# Patient Record
Sex: Male | Born: 1945 | Race: White | Hispanic: No | Marital: Married | Smoking: Former smoker
Health system: Southern US, Community
[De-identification: ages and names within clinical notes are randomized; demographics above are authoritative.]

## PROBLEM LIST (undated history)

## (undated) DIAGNOSIS — J309 Allergic rhinitis, unspecified: Secondary | ICD-10-CM

## (undated) DIAGNOSIS — L570 Actinic keratosis: Secondary | ICD-10-CM

## (undated) DIAGNOSIS — I10 Essential (primary) hypertension: Secondary | ICD-10-CM

## (undated) DIAGNOSIS — R7303 Prediabetes: Secondary | ICD-10-CM

## (undated) DIAGNOSIS — K219 Gastro-esophageal reflux disease without esophagitis: Secondary | ICD-10-CM

## (undated) DIAGNOSIS — N400 Enlarged prostate without lower urinary tract symptoms: Secondary | ICD-10-CM

## (undated) DIAGNOSIS — E785 Hyperlipidemia, unspecified: Secondary | ICD-10-CM

## (undated) HISTORY — DX: Actinic keratosis: L57.0

## (undated) HISTORY — PX: OTHER SURGICAL HISTORY: SHX169

## (undated) HISTORY — PX: CHOLECYSTECTOMY: SHX55

---

## 2004-11-04 ENCOUNTER — Ambulatory Visit: Payer: Self-pay | Admitting: Gastroenterology

## 2013-08-19 DIAGNOSIS — N5089 Other specified disorders of the male genital organs: Secondary | ICD-10-CM | POA: Insufficient documentation

## 2015-04-03 ENCOUNTER — Encounter: Payer: Self-pay | Admitting: *Deleted

## 2015-04-06 ENCOUNTER — Ambulatory Visit: Payer: Medicare PPO | Admitting: Anesthesiology

## 2015-04-06 ENCOUNTER — Ambulatory Visit
Admission: RE | Admit: 2015-04-06 | Discharge: 2015-04-06 | Disposition: A | Discharge status: Home / Self Care | Payer: Medicare PPO | Source: Ambulatory Visit | Attending: Gastroenterology | Admitting: Gastroenterology

## 2015-04-06 ENCOUNTER — Encounter
Admission: RE | Disposition: A | Discharge status: Home / Self Care | Payer: Self-pay | Source: Ambulatory Visit | Attending: Gastroenterology

## 2015-04-06 DIAGNOSIS — N400 Enlarged prostate without lower urinary tract symptoms: Secondary | ICD-10-CM | POA: Diagnosis not present

## 2015-04-06 DIAGNOSIS — E119 Type 2 diabetes mellitus without complications: Secondary | ICD-10-CM | POA: Diagnosis not present

## 2015-04-06 DIAGNOSIS — E785 Hyperlipidemia, unspecified: Secondary | ICD-10-CM | POA: Diagnosis not present

## 2015-04-06 DIAGNOSIS — K5730 Diverticulosis of large intestine without perforation or abscess without bleeding: Secondary | ICD-10-CM | POA: Diagnosis not present

## 2015-04-06 DIAGNOSIS — I10 Essential (primary) hypertension: Secondary | ICD-10-CM | POA: Insufficient documentation

## 2015-04-06 DIAGNOSIS — Z1211 Encounter for screening for malignant neoplasm of colon: Secondary | ICD-10-CM | POA: Diagnosis present

## 2015-04-06 HISTORY — PX: COLONOSCOPY WITH PROPOFOL: SHX5780

## 2015-04-06 HISTORY — DX: Hyperlipidemia, unspecified: E78.5

## 2015-04-06 HISTORY — DX: Benign prostatic hyperplasia without lower urinary tract symptoms: N40.0

## 2015-04-06 HISTORY — DX: Essential (primary) hypertension: I10

## 2015-04-06 HISTORY — DX: Gastro-esophageal reflux disease without esophagitis: K21.9

## 2015-04-06 HISTORY — DX: Allergic rhinitis, unspecified: J30.9

## 2015-04-06 LAB — GLUCOSE, CAPILLARY: Glucose-Capillary: 109 mg/dL — ABNORMAL HIGH (ref 65–99)

## 2015-04-06 SURGERY — COLONOSCOPY WITH PROPOFOL
Anesthesia: General

## 2015-04-06 MED ORDER — SODIUM CHLORIDE 0.9 % IV SOLN
INTRAVENOUS | Status: DC
  Administered 2015-04-06: 1000 mL via INTRAVENOUS

## 2015-04-06 MED ORDER — LIDOCAINE HCL (CARDIAC) 20 MG/ML IV SOLN
INTRAVENOUS | Status: DC | PRN
  Administered 2015-04-06: 40 mg via INTRAVENOUS

## 2015-04-06 MED ORDER — PROPOFOL 10 MG/ML IV BOLUS
INTRAVENOUS | Status: DC | PRN
  Administered 2015-04-06: 50 mg via INTRAVENOUS

## 2015-04-06 MED ORDER — PROPOFOL INFUSION 10 MG/ML OPTIME
INTRAVENOUS | Status: DC | PRN
  Administered 2015-04-06: 140 ug/kg/min via INTRAVENOUS

## 2015-04-06 MED ORDER — SODIUM CHLORIDE 0.9 % IV SOLN: INTRAVENOUS | Status: DC

## 2015-04-06 MED ORDER — MIDAZOLAM HCL 2 MG/2ML IJ SOLN
INTRAMUSCULAR | Status: DC | PRN
  Administered 2015-04-06: 1 mg via INTRAVENOUS

## 2015-04-06 MED ORDER — FENTANYL CITRATE (PF) 100 MCG/2ML IJ SOLN
INTRAMUSCULAR | Status: DC | PRN
  Administered 2015-04-06: 50 ug via INTRAVENOUS

## 2015-04-06 NOTE — H&P (Signed)
    Primary Care Physician:  Melynda Ripple, MD Primary Gastroenterologist:  Dr. Candace Cruise  Pre-Procedure History & Physical: HPI:  Joshua Drake is a 69 y.o. male is here for an colonoscopy.   Past Medical History  Diagnosis Date  . Hypertension   . GERD (gastroesophageal reflux disease)   . Diabetes mellitus without complication   . BPH (benign prostatic hyperplasia)   . Allergic rhinitis   . Hyperlipidemia     Past Surgical History  Procedure Laterality Date  . Cholecystectomy    . Microwave procedure      microwave procedure for BPH    Prior to Admission medications   Medication Sig Start Date End Date Taking? Authorizing Provider  aspirin 81 MG tablet Take 81 mg by mouth daily.   Yes Historical Provider, MD  atorvastatin (LIPITOR) 10 MG tablet Take 10 mg by mouth daily.   Yes Historical Provider, MD  dutasteride (AVODART) 0.5 MG capsule Take 0.5 mg by mouth daily.   Yes Historical Provider, MD  esomeprazole (NEXIUM) 40 MG capsule Take 40 mg by mouth daily at 12 noon.   Yes Historical Provider, MD  lisinopril-hydrochlorothiazide (PRINZIDE,ZESTORETIC) 10-12.5 MG per tablet Take 1 tablet by mouth daily.   Yes Historical Provider, MD  triamcinolone cream (KENALOG) 0.5 % Apply 1 application topically 2 (two) times daily.   Yes Historical Provider, MD    Allergies as of 03/10/2015  . (Not on File)    History reviewed. No pertinent family history.  History   Social History  . Marital Status: Married    Spouse Name: N/A  . Number of Children: N/A  . Years of Education: N/A   Occupational History  . Not on file.   Social History Main Topics  . Smoking status: Former Smoker -- 0.50 packs/day for 4 years    Types: Cigarettes    Quit date: 10/04/1963  . Smokeless tobacco: Never Used  . Alcohol Use: No  . Drug Use: No  . Sexual Activity: Not on file   Other Topics Concern  . Not on file   Social History Narrative  . No narrative on file    Review of Systems: See  HPI, otherwise negative ROS  Physical Exam: There were no vitals taken for this visit. General:   Alert,  pleasant and cooperative in NAD Head:  Normocephalic and atraumatic. Neck:  Supple; no masses or thyromegaly. Lungs:  Clear throughout to auscultation.    Heart:  Regular rate and rhythm. Abdomen:  Soft, nontender and nondistended. Normal bowel sounds, without guarding, and without rebound.   Neurologic:  Alert and  oriented x4;  grossly normal neurologically.  Impression/Plan: DENSEL KRONICK is here for an colonoscopy for screening.  Risks, benefits, limitations, and alternatives regarding colonoscopy have been reviewed with the patient.  Questions have been answered.  All parties agreeable.   Harlow Carrizales, Lupita Dawn, MD  04/06/2015, 7:55 AM

## 2015-04-06 NOTE — Anesthesia Preprocedure Evaluation (Signed)
Anesthesia Evaluation  Patient identified by MRN, date of birth, ID band Patient awake    Reviewed: Allergy & Precautions, NPO status , Patient's Chart, lab work & pertinent test results  History of Anesthesia Complications Negative for: history of anesthetic complications  Airway Mallampati: II  TM Distance: >3 FB Neck ROM: Full    Dental  (+) Teeth Intact   Pulmonary former smoker,          Cardiovascular hypertension, Pt. on medications     Neuro/Psych    GI/Hepatic GERD-  Medicated and Controlled,  Endo/Other  diabetes  Renal/GU      Musculoskeletal   Abdominal   Peds  Hematology   Anesthesia Other Findings   Reproductive/Obstetrics                             Anesthesia Physical Anesthesia Plan  ASA: II  Anesthesia Plan: General   Post-op Pain Management:    Induction: Intravenous  Airway Management Planned: Nasal Cannula  Additional Equipment:   Intra-op Plan:   Post-operative Plan:   Informed Consent: I have reviewed the patients History and Physical, chart, labs and discussed the procedure including the risks, benefits and alternatives for the proposed anesthesia with the patient or authorized representative who has indicated his/her understanding and acceptance.     Plan Discussed with:   Anesthesia Plan Comments:         Anesthesia Quick Evaluation

## 2015-04-06 NOTE — Anesthesia Procedure Notes (Signed)
Date/Time: 04/06/2015 9:00 AM Performed by: Doreen Salvage Pre-anesthesia Checklist: Patient identified, Emergency Drugs available, Suction available and Patient being monitored Patient Re-evaluated:Patient Re-evaluated prior to inductionOxygen Delivery Method: Nasal cannula

## 2015-04-06 NOTE — Anesthesia Postprocedure Evaluation (Signed)
  Anesthesia Post-op Note  Patient: Joshua Drake  Procedure(s) Performed: Procedure(s): COLONOSCOPY WITH PROPOFOL (N/A)  Anesthesia type:General  Patient location: PACU  Post pain: Pain level controlled  Post assessment: Post-op Vital signs reviewed, Patient's Cardiovascular Status Stable, Respiratory Function Stable, Patent Airway and No signs of Nausea or vomiting  Post vital signs: Reviewed and stable  Last Vitals:  Filed Vitals:   04/06/15 0930  BP: 102/70  Pulse: 67  Temp: 36.1 C  Resp: 12    Level of consciousness: awake, alert  and patient cooperative  Complications: No apparent anesthesia complications

## 2015-04-06 NOTE — Transfer of Care (Signed)
Immediate Anesthesia Transfer of Care Note  Patient: Joshua Drake  Procedure(s) Performed: Procedure(s): COLONOSCOPY WITH PROPOFOL (N/A)  Patient Location: PACU and Endoscopy Unit  Anesthesia Type:General  Level of Consciousness: sedated  Airway & Oxygen Therapy: Patient Spontanous Breathing and Patient connected to nasal cannula oxygen  Post-op Assessment: Report given to RN and Post -op Vital signs reviewed and stable  Post vital signs: Reviewed and stable  Last Vitals:  Filed Vitals:   04/06/15 0928  BP: 102/70  Pulse: 70  Temp: 36.1 C  Resp:     Complications: No apparent anesthesia complications

## 2015-04-06 NOTE — Op Note (Signed)
[facility] Patient Name: Joshua Drake Procedure Date: 04/06/2015 8:58 AM MRN: [id number] Account #: [id number] Date of Birth: Mar 22, 1946 Admit Type: Outpatient Age: 69 Room: [facility] ENDO ROOM 4 Gender: Male Note Status: Finalized Procedure:         Colonoscopy Indications:       Screening for colorectal malignant neoplasm Providers:         Lupita Dawn. Candace Cruise, MD Referring MD:      Dion Body (Referring MD) Medicines:         Monitored Anesthesia Care Complications:     No immediate complications. Procedure:         Pre-Anesthesia Assessment:                    - Prior to the procedure, a History and Physical was                     performed, and patient medications, allergies and                     sensitivities were reviewed. The patient's tolerance of                     previous anesthesia was reviewed.                    - The risks and benefits of the procedure and the sedation                     options and risks were discussed with the patient. All                     questions were answered and informed consent was obtained.                    - After reviewing the risks and benefits, the patient was                     deemed in satisfactory condition to undergo the procedure.                    After obtaining informed consent, the colonoscope was                     passed under direct vision. Throughout the procedure, the                     patient's blood pressure, pulse, and oxygen saturations                     were monitored continuously. The Olympus Colonoscope                     PCF-160AL (S# [id number]) was introduced through the anus and                     advanced to the the cecum, identified by appendiceal                     orifice and ileocecal valve. The colonoscopy was performed                     with difficulty due to inadequate bowel prep. The patient  tolerated the procedure well. The quality of  the bowel                     preparation was fair. Findings:      Multiple small and large-mouthed diverticula were found in the sigmoid       colon.      The exam was otherwise without abnormality. Impression:        - Diverticulosis in the sigmoid colon.                    - The examination was otherwise normal.                    - No specimens collected. Recommendation:    - Discharge patient to home.                    - The findings and recommendations were discussed with the                     patient. Procedure Code(s): --- Professional ---                    [phone], Colonoscopy, flexible; diagnostic, including                     collection of specimen(s) by brushing or washing, when                     performed (separate procedure) Diagnosis Code(s): --- Professional ---                    Z12.11, Encounter for screening for malignant neoplasm of                     colon                    K57.30, Diverticulosis of large intestine without                     perforation or abscess without bleeding CPT copyright 2014 American Medical Association. All rights reserved. The codes documented in this report are preliminary and upon coder review may  be revised to meet current compliance requirements. [name], MD 04/06/2015 9:19:38 AM This report has been signed electronically. Number of Addenda: 0 Note Initiated On: 04/06/2015 8:58 AM Scope Withdrawal Time: 0 hours 5 minutes 3 seconds  Total Procedure Duration: 0 hours 10 minutes 36 seconds       [facility]

## 2015-04-06 NOTE — Addendum Note (Signed)
Addendum  created 04/06/15 1058 by Doreen Salvage, CRNA   Modules edited: Anesthesia Medication Administration

## 2015-04-07 ENCOUNTER — Encounter: Payer: Self-pay | Admitting: Gastroenterology

## 2016-01-28 ENCOUNTER — Other Ambulatory Visit (HOSPITAL_COMMUNITY): Payer: Self-pay | Admitting: Family Medicine

## 2016-01-28 DIAGNOSIS — Z7185 Encounter for immunization safety counseling: Secondary | ICD-10-CM | POA: Insufficient documentation

## 2016-01-28 DIAGNOSIS — Z0000 Encounter for general adult medical examination without abnormal findings: Secondary | ICD-10-CM

## 2016-01-28 DIAGNOSIS — Z87891 Personal history of nicotine dependence: Secondary | ICD-10-CM

## 2016-01-28 DIAGNOSIS — Z7189 Other specified counseling: Secondary | ICD-10-CM | POA: Insufficient documentation

## 2016-02-02 ENCOUNTER — Ambulatory Visit
Admission: RE | Admit: 2016-02-02 | Discharge: 2016-02-02 | Disposition: A | Discharge status: Home / Self Care | Payer: Medicare PPO | Source: Ambulatory Visit | Attending: Family Medicine | Admitting: Family Medicine

## 2016-02-02 DIAGNOSIS — Z0000 Encounter for general adult medical examination without abnormal findings: Secondary | ICD-10-CM | POA: Diagnosis not present

## 2016-02-02 DIAGNOSIS — Z87891 Personal history of nicotine dependence: Secondary | ICD-10-CM | POA: Insufficient documentation

## 2017-01-30 DIAGNOSIS — Z0000 Encounter for general adult medical examination without abnormal findings: Secondary | ICD-10-CM | POA: Insufficient documentation

## 2017-07-13 ENCOUNTER — Encounter: Payer: Self-pay | Admitting: Urology

## 2017-07-13 ENCOUNTER — Ambulatory Visit (INDEPENDENT_AMBULATORY_CARE_PROVIDER_SITE_OTHER): Payer: Medicare PPO | Admitting: Urology

## 2017-07-13 VITALS — BP 154/96 | HR 83 | Ht 66.0 in | Wt 173.0 lb

## 2017-07-13 DIAGNOSIS — N401 Enlarged prostate with lower urinary tract symptoms: Secondary | ICD-10-CM | POA: Diagnosis not present

## 2017-07-13 DIAGNOSIS — R972 Elevated prostate specific antigen [PSA]: Secondary | ICD-10-CM | POA: Insufficient documentation

## 2017-07-13 MED ORDER — DUTASTERIDE 0.5 MG PO CAPS: 0.5000 mg | ORAL_CAPSULE | Freq: Every day | ORAL | 1 refills | Status: DC

## 2017-07-13 NOTE — Progress Notes (Signed)
07/13/2017 6:41 PM   Joshua Drake May 29, 1946 [id number]  Referring provider: Dion Body, MD [facility] [place], [place] [address]  Chief Complaint  Patient presents with  . Elevated PSA    HPI: 71 year old male presents for follow-up of an elevated PSA.  Prostate biopsy was performed May 2013 for an uncorrected PSA of 12.2 with benign pathology.  Prostate volume was calculated at 81 cc.  He has a history of BPH and underwent microwave thermotherapy by Dr. Yves Dill in 2009.  He was placed on Avodart post procedure which he takes once per week.  But he has no bothersome lower urinary tract symptoms.  Denies dysuria or gross hematuria.  Denies flank, abdominal, pelvic or scrotal pain.  Uncorrected PSA August 2018 was within baseline at 6.24 however slightly higher than prior PSA in 2017 which was 4.09 and a 45-month follow-up was recommended.   PMH: Past Medical History:  Diagnosis Date  . Allergic rhinitis   . BPH (benign prostatic hyperplasia)   . Diabetes mellitus without complication ([place])   . GERD (gastroesophageal reflux disease)   . Hyperlipidemia   . Hypertension     Surgical History: Past Surgical History:  Procedure Laterality Date  . CHOLECYSTECTOMY    . COLONOSCOPY WITH PROPOFOL N/A 04/06/2015   Procedure: COLONOSCOPY WITH PROPOFOL;  Surgeon: [name], MD;  Location: [facility] ENDOSCOPY;  Service: Gastroenterology;  Laterality: N/A;  . microwave procedure     microwave procedure for BPH    Home Medications:  Allergies as of 07/13/2017   Not on File     Medication List       Accurate as of 07/13/17  6:41 PM. Always use your most recent med list.          amoxicillin 875 MG tablet Commonly known as:  AMOXIL   aspirin 81 MG tablet Take 81 mg by mouth daily.   atorvastatin 10 MG tablet Commonly known as:  LIPITOR Take 10 mg by mouth daily.   dutasteride 0.5 MG capsule Commonly known as:  AVODART Take 1 capsule (0.5 mg  total) by mouth daily.   esomeprazole 40 MG capsule Commonly known as:  NEXIUM Take 40 mg by mouth daily at 12 noon.   lisinopril-hydrochlorothiazide 10-12.5 MG tablet Commonly known as:  PRINZIDE,ZESTORETIC Take 1 tablet by mouth daily.   triamcinolone cream 0.5 % Commonly known as:  KENALOG Apply 1 application topically 2 (two) times daily.       Allergies: Not on File  Family History: Family History  Problem Relation Age of Onset  . Benign prostatic hyperplasia Father   . Benign prostatic hyperplasia Brother   . Kidney cancer Neg Hx   . Kidney disease Neg Hx   . Prostate cancer Neg Hx     Social History:  reports that he quit smoking about 53 years ago. His smoking use included Cigarettes. He has a 2.00 pack-year smoking history. He has never used smokeless tobacco. He reports that he does not drink alcohol or use drugs.  ROS: UROLOGY Frequent Urination?: No Hard to postpone urination?: No Burning/pain with urination?: No Get up at night to urinate?: No Leakage of urine?: No Urine stream starts and stops?: No Trouble starting stream?: No Do you have to strain to urinate?: No Blood in urine?: No Urinary tract infection?: No Sexually transmitted disease?: No Injury to kidneys or bladder?: No Painful intercourse?: No Weak stream?: No Erection problems?: No Penile pain?: No  Gastrointestinal  Nausea?: No Vomiting?: No Indigestion/heartburn?: No Diarrhea?: No Constipation?: No  Constitutional Fever: No Night sweats?: No Weight loss?: No Fatigue?: No  Skin Skin rash/lesions?: No Itching?: No  Eyes Blurred vision?: No Double vision?: No  Ears/Nose/Throat Sore throat?: No Sinus problems?: No  Hematologic/Lymphatic Swollen glands?: No Easy bruising?: No  Cardiovascular Leg swelling?: No Chest pain?: No  Respiratory Cough?: No Shortness of breath?: No  Endocrine Excessive thirst?: No  Musculoskeletal Back pain?: No Joint pain?:  No  Neurological Headaches?: No Dizziness?: No  Psychologic Depression?: No Anxiety?: No  Physical Exam: BP (!) 154/96   Pulse 83   Ht 5\' 6"  (1.676 m)   Wt 173 lb (78.5 kg)   BMI 27.92 kg/m   Constitutional:  Alert and oriented, No acute distress. HEENT: [place] AT, moist mucus membranes.  Trachea midline, no masses. Cardiovascular: No clubbing, cyanosis, or edema. Respiratory: Normal respiratory effort, no increased work of breathing. GI: Abdomen is soft, nontender, nondistended, no abdominal masses GU: No CVA tenderness.  Skin: No rashes, bruises or suspicious lesions. Lymph: No cervical or inguinal adenopathy. Neurologic: Grossly intact, no focal deficits, moving all 4 extremities. Psychiatric: Normal mood and affect.   Assessment & Plan:    1. Elevated PSA PSA was repeated today and is stable he will follow-up in 6 months.  If increasing will schedule a multi-parametric prostate MRI.  - PSA  2. Benign prostatic hyperplasia with lower urinary tract symptoms, symptom details unspecified He has no bothersome lower urinary tract symptoms.  Avodart was refilled.   Return in about 6 months (around 01/10/2018) for Recheck, PSA.  Abbie Sons, [place] [address] [place], [place] [address] [phone]

## 2017-07-14 LAB — PSA: PROSTATE SPECIFIC AG, SERUM: 6.5 ng/mL — AB (ref 0.0–4.0)

## 2017-07-18 ENCOUNTER — Other Ambulatory Visit: Payer: Self-pay | Admitting: Urology

## 2017-07-18 ENCOUNTER — Telehealth: Payer: Self-pay

## 2017-07-18 DIAGNOSIS — R972 Elevated prostate specific antigen [PSA]: Secondary | ICD-10-CM

## 2017-07-18 NOTE — Telephone Encounter (Signed)
-----   Message from Abbie Sons, MD sent at 07/18/2017 12:35 PM EST ----- PSA slightly higher at 6.5.  Recommend a follow-up PSA in 4 months.  Order was entered

## 2017-07-18 NOTE — Telephone Encounter (Signed)
Spoke with pt in reference to PSA results and needing a f/u in 84mo. Pt voiced understanding. Appts were previously made.

## 2017-08-09 ENCOUNTER — Other Ambulatory Visit
Admission: RE | Admit: 2017-08-09 | Discharge: 2017-08-09 | Disposition: A | Discharge status: Home / Self Care | Payer: Medicare PPO | Source: Ambulatory Visit | Attending: Family Medicine | Admitting: Family Medicine

## 2017-08-09 DIAGNOSIS — Z87898 Personal history of other specified conditions: Secondary | ICD-10-CM | POA: Insufficient documentation

## 2017-08-09 DIAGNOSIS — Z8619 Personal history of other infectious and parasitic diseases: Secondary | ICD-10-CM | POA: Diagnosis present

## 2017-08-09 LAB — C DIFFICILE QUICK SCREEN W PCR REFLEX
C DIFFICLE (CDIFF) ANTIGEN: POSITIVE — AB
C Diff interpretation: DETECTED
C Diff toxin: POSITIVE — AB

## 2017-11-17 ENCOUNTER — Other Ambulatory Visit: Payer: Self-pay

## 2017-11-17 ENCOUNTER — Encounter
Admission: RE | Admit: 2017-11-17 | Discharge: 2017-11-17 | Disposition: A | Discharge status: Home / Self Care | Payer: Medicare PPO | Source: Ambulatory Visit | Attending: Orthopedic Surgery | Admitting: Orthopedic Surgery

## 2017-11-17 DIAGNOSIS — I10 Essential (primary) hypertension: Secondary | ICD-10-CM | POA: Insufficient documentation

## 2017-11-17 DIAGNOSIS — Z01812 Encounter for preprocedural laboratory examination: Secondary | ICD-10-CM | POA: Diagnosis not present

## 2017-11-17 DIAGNOSIS — Z01810 Encounter for preprocedural cardiovascular examination: Secondary | ICD-10-CM | POA: Insufficient documentation

## 2017-11-17 HISTORY — DX: Prediabetes: R73.03

## 2017-11-17 LAB — BASIC METABOLIC PANEL
ANION GAP: 11 (ref 5–15)
BUN: 12 mg/dL (ref 6–20)
CHLORIDE: 97 mmol/L — AB (ref 101–111)
CO2: 27 mmol/L (ref 22–32)
CREATININE: 0.89 mg/dL (ref 0.61–1.24)
Calcium: 9.4 mg/dL (ref 8.9–10.3)
GFR calc non Af Amer: >60 mL/min (ref >60)
Glucose, Bld: 102 mg/dL — ABNORMAL HIGH (ref 65–99)
POTASSIUM: 3.7 mmol/L (ref 3.5–5.1)
SODIUM: 135 mmol/L (ref 135–145)

## 2017-11-17 LAB — CBC
HCT: 43.6 % (ref 40.0–52.0)
HEMOGLOBIN: 14.8 g/dL (ref 13.0–18.0)
MCH: 31.3 pg (ref 26.0–34.0)
MCHC: 34 g/dL (ref 32.0–36.0)
MCV: 92.2 fL (ref 80.0–100.0)
Platelets: 185 10*3/uL (ref 150–440)
RBC: 4.73 MIL/uL (ref 4.40–5.90)
RDW: 13.7 % (ref 11.5–14.5)
WBC: 6.8 10*3/uL (ref 3.8–10.6)

## 2017-11-17 NOTE — Patient Instructions (Addendum)
Your procedure is scheduled on: November 21, 2017 TUESDAY Report to Day Surgery on the 2nd floor of the [facility]. To find out your arrival time, please call [phone] between 1PM - 3PM on: Monday November 20, 2017   REMEMBER: Instructions that are not followed completely may result in serious medical risk, up to and including death; or upon the discretion of your surgeon and anesthesiologist your surgery may need to be rescheduled.  Do not eat food after midnight the night before your procedure.  No gum chewing, lozengers or hard candies.  You may however, drink CLEAR liquids up to 2 hours before you are scheduled to arrive for your surgery. Do not drink anything within 2 hours of the start of your surgery.  Clear liquids include: - water  - apple juice without pulp - clear gatorade - black coffee or tea (Do NOT add anything to the coffee or tea) Do NOT drink anything that is not on this list.  No Alcohol for 24 hours before or after surgery.  No Smoking including e-cigarettes for 24 hours prior to surgery.  No chewable tobacco products for at least 6 hours prior to surgery.  No nicotine patches on the day of surgery.  On the morning of surgery brush your teeth with toothpaste and water, you may rinse your mouth with mouthwash if you wish. Do not swallow any toothpaste or mouthwash.  Notify your doctor if there is any change in your medical condition (cold, fever, infection).  Do not wear jewelry, make-up, hairpins, clips or nail polish.  Do not wear lotions, powders, or perfumes. You may NOT wear deodorant.  Do not shave 48 hours prior to surgery. Men may shave face and neck.  Contacts and dentures may not be worn into surgery.  Do not bring valuables to the hospital, including drivers license, insurance or credit cards.  [facility] is not responsible for any belongings or valuables.   TAKE THESE MEDICATIONS THE MORNING OF SURGERY: LIPITOR TRAMADOL NEXIUM TAKE A DOSE  THE NIGHT BEFORE SURGERY AND THE MORNING OF SURGERY  Use CHG Soap  as directed on instruction sheet.   Follow recommendations from Cardiologist, Pulmonologist or PCP regarding stopping Aspirin, Coumadin, Plavix, Eliquis, Pradaxa, or Pletal. CHRIS GAINES TOLD PT HE DID NOT NEED TO STOP ASPIRIN  Stop Anti-inflammatories (NSAIDS) such as Advil, Aleve, Ibuprofen, Motrin, Naproxen, Naprosyn and Aspirin based products such as Excedrin, Goodys Powder, BC Powder. (May take Tylenol or Acetaminophen if needed.)  Wear comfortable clothing (specific to your surgery type) to the hospital.  Plan for stool softeners for home use.  If you are being discharged the day of surgery, you will not be allowed to drive home. You will need a responsible adult to drive you home and stay with you that night.   If you are taking public transportation, you will need to have a responsible adult with you. Please confirm with your physician that it is acceptable to use public transportation.   Please call [phone] if you have any questions about these instructions.

## 2017-11-21 ENCOUNTER — Other Ambulatory Visit: Payer: Self-pay

## 2017-11-21 ENCOUNTER — Ambulatory Visit: Payer: Medicare PPO | Admitting: Certified Registered Nurse Anesthetist

## 2017-11-21 ENCOUNTER — Encounter
Admission: RE | Disposition: A | Discharge status: Home / Self Care | Payer: Self-pay | Source: Ambulatory Visit | Attending: Orthopedic Surgery

## 2017-11-21 ENCOUNTER — Ambulatory Visit
Admission: RE | Admit: 2017-11-21 | Discharge: 2017-11-21 | Disposition: A | Discharge status: Home / Self Care | Payer: Medicare PPO | Source: Ambulatory Visit | Attending: Orthopedic Surgery | Admitting: Orthopedic Surgery

## 2017-11-21 ENCOUNTER — Encounter: Payer: Self-pay | Admitting: *Deleted

## 2017-11-21 DIAGNOSIS — W010XXA Fall on same level from slipping, tripping and stumbling without subsequent striking against object, initial encounter: Secondary | ICD-10-CM | POA: Insufficient documentation

## 2017-11-21 DIAGNOSIS — Z79891 Long term (current) use of opiate analgesic: Secondary | ICD-10-CM | POA: Diagnosis not present

## 2017-11-21 DIAGNOSIS — N401 Enlarged prostate with lower urinary tract symptoms: Secondary | ICD-10-CM | POA: Insufficient documentation

## 2017-11-21 DIAGNOSIS — K219 Gastro-esophageal reflux disease without esophagitis: Secondary | ICD-10-CM | POA: Insufficient documentation

## 2017-11-21 DIAGNOSIS — Z87891 Personal history of nicotine dependence: Secondary | ICD-10-CM | POA: Diagnosis not present

## 2017-11-21 DIAGNOSIS — S52571A Other intraarticular fracture of lower end of right radius, initial encounter for closed fracture: Secondary | ICD-10-CM | POA: Insufficient documentation

## 2017-11-21 DIAGNOSIS — Z7982 Long term (current) use of aspirin: Secondary | ICD-10-CM | POA: Insufficient documentation

## 2017-11-21 DIAGNOSIS — Y92481 Parking lot as the place of occurrence of the external cause: Secondary | ICD-10-CM | POA: Diagnosis not present

## 2017-11-21 DIAGNOSIS — E785 Hyperlipidemia, unspecified: Secondary | ICD-10-CM | POA: Diagnosis not present

## 2017-11-21 DIAGNOSIS — Z79899 Other long term (current) drug therapy: Secondary | ICD-10-CM | POA: Insufficient documentation

## 2017-11-21 DIAGNOSIS — I10 Essential (primary) hypertension: Secondary | ICD-10-CM | POA: Diagnosis not present

## 2017-11-21 HISTORY — PX: OPEN REDUCTION INTERNAL FIXATION (ORIF) DISTAL RADIAL FRACTURE: SHX5989

## 2017-11-21 SURGERY — OPEN REDUCTION INTERNAL FIXATION (ORIF) DISTAL RADIUS FRACTURE
Anesthesia: General | Site: Wrist | Laterality: Right | Wound class: Clean

## 2017-11-21 MED ORDER — DEXAMETHASONE SODIUM PHOSPHATE 10 MG/ML IJ SOLN
INTRAMUSCULAR | Status: AC
  Filled 2017-11-21: qty 1

## 2017-11-21 MED ORDER — CEFAZOLIN SODIUM-DEXTROSE 1-4 GM/50ML-% IV SOLN
INTRAVENOUS | Status: AC
  Filled 2017-11-21: qty 50

## 2017-11-21 MED ORDER — HYDROMORPHONE HCL 1 MG/ML IJ SOLN: 0.5000 mg | Freq: Once | INTRAMUSCULAR | Status: DC

## 2017-11-21 MED ORDER — LACTATED RINGERS IV SOLN
INTRAVENOUS | Status: DC
  Administered 2017-11-21: 12:00:00 via INTRAVENOUS

## 2017-11-21 MED ORDER — FENTANYL CITRATE (PF) 100 MCG/2ML IJ SOLN
INTRAMUSCULAR | Status: AC
  Administered 2017-11-21: 25 ug via INTRAVENOUS
  Filled 2017-11-21: qty 2

## 2017-11-21 MED ORDER — PROPOFOL 10 MG/ML IV BOLUS
INTRAVENOUS | Status: DC | PRN
  Administered 2017-11-21: 130 mg via INTRAVENOUS

## 2017-11-21 MED ORDER — OXYCODONE-ACETAMINOPHEN 5-325 MG PO TABS
1.0000 | ORAL_TABLET | ORAL | Status: DC | PRN
  Administered 2017-11-21: 2 via ORAL

## 2017-11-21 MED ORDER — SODIUM CHLORIDE FLUSH 0.9 % IV SOLN
INTRAVENOUS | Status: AC
  Filled 2017-11-21: qty 10

## 2017-11-21 MED ORDER — ONDANSETRON HCL 4 MG/2ML IJ SOLN
INTRAMUSCULAR | Status: AC
  Filled 2017-11-21: qty 2

## 2017-11-21 MED ORDER — EPHEDRINE SULFATE 50 MG/ML IJ SOLN
INTRAMUSCULAR | Status: AC
  Administered 2017-11-21: 10 mg
  Filled 2017-11-21: qty 1

## 2017-11-21 MED ORDER — LIDOCAINE HCL (CARDIAC) 20 MG/ML IV SOLN
INTRAVENOUS | Status: DC | PRN
  Administered 2017-11-21: 60 mg via INTRAVENOUS

## 2017-11-21 MED ORDER — MIDAZOLAM HCL 2 MG/2ML IJ SOLN
INTRAMUSCULAR | Status: AC
  Filled 2017-11-21: qty 2

## 2017-11-21 MED ORDER — KETOROLAC TROMETHAMINE 30 MG/ML IJ SOLN
INTRAMUSCULAR | Status: AC
  Filled 2017-11-21: qty 1

## 2017-11-21 MED ORDER — NEOMYCIN-POLYMYXIN B GU 40-200000 IR SOLN
Status: AC
  Filled 2017-11-21: qty 2

## 2017-11-21 MED ORDER — DEXAMETHASONE SODIUM PHOSPHATE 10 MG/ML IJ SOLN
INTRAMUSCULAR | Status: DC | PRN
  Administered 2017-11-21: 10 mg via INTRAVENOUS

## 2017-11-21 MED ORDER — LIDOCAINE HCL (PF) 2 % IJ SOLN
INTRAMUSCULAR | Status: AC
Start: 2017-11-21 — End: ?
  Filled 2017-11-21: qty 10

## 2017-11-21 MED ORDER — ONDANSETRON HCL 4 MG/2ML IJ SOLN: 4.0000 mg | Freq: Once | INTRAMUSCULAR | Status: DC | PRN

## 2017-11-21 MED ORDER — SODIUM CHLORIDE 0.9 % IJ SOLN
INTRAMUSCULAR | Status: AC
  Filled 2017-11-21: qty 10

## 2017-11-21 MED ORDER — FENTANYL CITRATE (PF) 100 MCG/2ML IJ SOLN
INTRAMUSCULAR | Status: AC
  Filled 2017-11-21: qty 2

## 2017-11-21 MED ORDER — FENTANYL CITRATE (PF) 100 MCG/2ML IJ SOLN
INTRAMUSCULAR | Status: DC | PRN
  Administered 2017-11-21 (×4): 25 ug via INTRAVENOUS

## 2017-11-21 MED ORDER — FENTANYL CITRATE (PF) 100 MCG/2ML IJ SOLN
25.0000 ug | INTRAMUSCULAR | Status: DC | PRN
  Administered 2017-11-21 (×4): 25 ug via INTRAVENOUS

## 2017-11-21 MED ORDER — HYDROMORPHONE HCL 1 MG/ML IJ SOLN
INTRAMUSCULAR | Status: AC
  Administered 2017-11-21: 0.5 mg via INTRAVENOUS
  Filled 2017-11-21: qty 1

## 2017-11-21 MED ORDER — PROPOFOL 10 MG/ML IV BOLUS
INTRAVENOUS | Status: AC
  Filled 2017-11-21: qty 20

## 2017-11-21 MED ORDER — NEOMYCIN-POLYMYXIN B GU 40-200000 IR SOLN
Status: DC | PRN
  Administered 2017-11-21: 2 mL

## 2017-11-21 MED ORDER — OXYCODONE-ACETAMINOPHEN 5-325 MG PO TABS
ORAL_TABLET | ORAL | Status: AC
  Administered 2017-11-21: 2 via ORAL
  Filled 2017-11-21: qty 2

## 2017-11-21 MED ORDER — ONDANSETRON HCL 4 MG/2ML IJ SOLN
INTRAMUSCULAR | Status: DC | PRN
  Administered 2017-11-21: 4 mg via INTRAVENOUS

## 2017-11-21 MED ORDER — CEFAZOLIN SODIUM-DEXTROSE 1-4 GM/50ML-% IV SOLN
1.0000 g | Freq: Once | INTRAVENOUS | Status: AC
  Administered 2017-11-21: 1 g via INTRAVENOUS

## 2017-11-21 MED ORDER — MIDAZOLAM HCL 2 MG/2ML IJ SOLN
INTRAMUSCULAR | Status: DC | PRN
  Administered 2017-11-21: 2 mg via INTRAVENOUS

## 2017-11-21 MED ORDER — KETOROLAC TROMETHAMINE 30 MG/ML IJ SOLN
INTRAMUSCULAR | Status: DC | PRN
  Administered 2017-11-21: 30 mg via INTRAVENOUS

## 2017-11-21 MED ORDER — HYDROMORPHONE HCL 1 MG/ML IJ SOLN
0.5000 mg | Freq: Once | INTRAMUSCULAR | Status: AC
  Administered 2017-11-21 (×2): 0.5 mg via INTRAVENOUS

## 2017-11-21 SURGICAL SUPPLY — 41 items
BANDAGE ACE 4X5 VEL STRL LF (GAUZE/BANDAGES/DRESSINGS) ×3 IMPLANT
BIT DRILL 2 FAST STEP (BIT) ×3 IMPLANT
BIT DRILL 2.5X4 QC (BIT) ×3 IMPLANT
CANISTER SUCT 1200ML W/VALVE (MISCELLANEOUS) ×3 IMPLANT
CHLORAPREP W/TINT 26ML (MISCELLANEOUS) ×3 IMPLANT
CUFF TOURN 18 STER (MISCELLANEOUS) ×3 IMPLANT
DRAPE FLUOR MINI C-ARM 54X84 (DRAPES) ×3 IMPLANT
ELECT REM PT RETURN 9FT ADLT (ELECTROSURGICAL) ×3
ELECTRODE REM PT RTRN 9FT ADLT (ELECTROSURGICAL) ×1 IMPLANT
GAUZE PETRO XEROFOAM 1X8 (MISCELLANEOUS) ×6 IMPLANT
GAUZE SPONGE 4X4 12PLY STRL (GAUZE/BANDAGES/DRESSINGS) ×3 IMPLANT
GLOVE BIOGEL PI IND STRL 7.0 (GLOVE) ×6 IMPLANT
GLOVE BIOGEL PI INDICATOR 7.0 (GLOVE) ×12
GLOVE SURG SYN 9.0  PF PI (GLOVE) ×2
GLOVE SURG SYN 9.0 PF PI (GLOVE) ×1 IMPLANT
GOWN SRG 2XL LVL 4 RGLN SLV (GOWNS) ×1 IMPLANT
GOWN STRL NON-REIN 2XL LVL4 (GOWNS) ×2
GOWN STRL REUS W/ TWL LRG LVL3 (GOWN DISPOSABLE) ×4 IMPLANT
GOWN STRL REUS W/TWL LRG LVL3 (GOWN DISPOSABLE) ×8
K-WIRE 1.6 (WIRE) ×2
K-WIRE FX5X1.6XNS BN SS (WIRE) ×1
KIT TURNOVER KIT A (KITS) ×3 IMPLANT
KWIRE FX5X1.6XNS BN SS (WIRE) ×1 IMPLANT
NEEDLE FILTER BLUNT 18X 1/2SAF (NEEDLE) ×2
NEEDLE FILTER BLUNT 18X1 1/2 (NEEDLE) ×1 IMPLANT
NS IRRIG 500ML POUR BTL (IV SOLUTION) ×3 IMPLANT
PACK EXTREMITY ARMC (MISCELLANEOUS) ×3 IMPLANT
PAD CAST CTTN 4X4 STRL (SOFTGOODS) ×2 IMPLANT
PADDING CAST COTTON 4X4 STRL (SOFTGOODS) ×4
PEG SUBCHONDRAL SMOOTH 2.0X14 (Peg) ×3 IMPLANT
PEG SUBCHONDRAL SMOOTH 2.0X20 (Peg) ×9 IMPLANT
PEG SUBCHONDRAL SMOOTH 2.0X22 (Peg) ×3 IMPLANT
PEG SUBCHONDRAL SMOOTH 2.0X24 (Peg) ×6 IMPLANT
PLATE SHORT 24.4X51.3 RT (Plate) ×3 IMPLANT
SCREW CORT 3.5X10 LNG (Screw) ×6 IMPLANT
SPLINT CAST 1 STEP 3X12 (MISCELLANEOUS) ×3 IMPLANT
SUT ETHILON 4-0 (SUTURE) ×2
SUT ETHILON 4-0 FS2 18XMFL BLK (SUTURE) ×1
SUT VICRYL 3-0 27IN (SUTURE) IMPLANT
SUTURE ETHLN 4-0 FS2 18XMF BLK (SUTURE) ×1 IMPLANT
SYR 3ML LL SCALE MARK (SYRINGE) ×3 IMPLANT

## 2017-11-21 NOTE — Op Note (Signed)
11/21/2017  12:57 PM  PATIENT:  Joshua Drake  72 y.o. male  PRE-OPERATIVE DIAGNOSIS:  CLOSED TRAUMATIC DISPLACED FRACTURE OF DISTAL END OF RIGHT RADIUS, intra-articular multiple parts 3  POST-OPERATIVE DIAGNOSIS:  CLOSED TRAUMATIC DISPLACED FRACTURE OF DISTAL END OF RIGHT RADIUS  PROCEDURE:  Procedure(s): OPEN REDUCTION INTERNAL FIXATION (ORIF) DISTAL RADIAL FRACTURE (Right)  SURGEON: Laurene Footman, MD  ASSISTANTS: None  ANESTHESIA:   general  EBL:  Total I/O In: 600 [I.V.:600] Out: 5 [Blood:5]  BLOOD ADMINISTERED:none  DRAINS: none   LOCAL MEDICATIONS USED:  NONE  SPECIMEN:  No Specimen  DISPOSITION OF SPECIMEN:  N/A  COUNTS:  YES  TOURNIQUET:  * Missing tourniquet times found for documented tourniquets in log: [id number] *30 minutes at 250 mmHg  IMPLANTS: Short standard width DVR plate with multiple smooth pegs and screws  DICTATION: .Dragon Dictation  patient brought the operating room and after adequate anesthesia was obtained, the right arm was prepped and draped in the sterile fashion.  After patient identification and timeout procedures were completed, tourniquet was raised.  Volar approach was made centered over the FCR tendon the tendon sheath was incised with the tendon retracted radially protect the radial artery and associated veins.  The deep fascia was incised in the pronator was elevated off the distal and proximal fragments.  Fingertrap is been applied at the start of the case and 10 pounds of traction applied to help restore length with a Soil scientist the radial deviation of the distal fragment could be corrected and a K wire was used to hold this in position essentially anatomic alignment was obtained.  A short standard DVR plate was then applied with multiple smooth pegs distally followed by removal of the K wire and placement of the 2 proximal cortical screws.  With for the traction removed the fracture was stable through range of motion with essentially  anatomic alignment with restoration of length volar tilt and radial inclination.  Tourniquet was let down the wound irrigated and closed with 3-0 Vicryl subcutaneously and 4-0 nylon for the skin.  Xeroform 4 x 4 volar splint with web roll and Ace wrap applied    PLAN OF CARE: Discharge to home after PACU  PATIENT DISPOSITION:  PACU - hemodynamically stable.

## 2017-11-21 NOTE — Anesthesia Preprocedure Evaluation (Signed)
Anesthesia Evaluation  Patient identified by MRN, date of birth, ID band Patient awake    Reviewed: Allergy & Precautions, H&P , NPO status , Patient's Chart, lab work & pertinent test results, reviewed documented beta blocker date and time   Airway Mallampati: II  TM Distance: >3 FB Neck ROM: full    Dental  (+) Teeth Intact   Pulmonary neg pulmonary ROS, former smoker,    Pulmonary exam normal        Cardiovascular Exercise Tolerance: Good hypertension, negative cardio ROS Normal cardiovascular exam Rate:Normal     Neuro/Psych negative neurological ROS  negative psych ROS   GI/Hepatic negative GI ROS, Neg liver ROS, GERD  Medicated,  Endo/Other  negative endocrine ROS  Renal/GU negative Renal ROS  negative genitourinary   Musculoskeletal   Abdominal   Peds  Hematology negative hematology ROS (+)   Anesthesia Other Findings   Reproductive/Obstetrics negative OB ROS                             Anesthesia Physical Anesthesia Plan  ASA: II  Anesthesia Plan: General LMA   Post-op Pain Management:    Induction:   PONV Risk Score and Plan:   Airway Management Planned:   Additional Equipment:   Intra-op Plan:   Post-operative Plan:   Informed Consent: I have reviewed the patients History and Physical, chart, labs and discussed the procedure including the risks, benefits and alternatives for the proposed anesthesia with the patient or authorized representative who has indicated his/her understanding and acceptance.     Plan Discussed with: CRNA  Anesthesia Plan Comments:         Anesthesia Quick Evaluation

## 2017-11-21 NOTE — OR Nursing (Signed)
Discussed discharge instructions with pt and wife. Both voice understanding. 

## 2017-11-21 NOTE — Discharge Instructions (Addendum)
Keep arm elevated.  Work on finger range of motion call if you need pain medication  AMBULATORY SURGERY  DISCHARGE INSTRUCTIONS   1) The drugs that you were given will stay in your system until tomorrow so for the next 24 hours you should not:  A) Drive an automobile B) Make any legal decisions C) Drink any alcoholic beverage   2) You may resume regular meals tomorrow.  Today it is better to start with liquids and gradually work up to solid foods.  You may eat anything you prefer, but it is better to start with liquids, then soup and crackers, and gradually work up to solid foods.   3) Please notify your doctor immediately if you have any unusual bleeding, trouble breathing, redness and pain at the surgery site, drainage, fever, or pain not relieved by medication.    4) Additional Instructions:        Please contact your physician with any problems or Same Day Surgery at [phone], Monday through Friday 6 am to 4 pm, or [place] at [facility] number at [phone].

## 2017-11-21 NOTE — Anesthesia Post-op Follow-up Note (Signed)
Anesthesia QCDR form completed.        

## 2017-11-21 NOTE — Anesthesia Procedure Notes (Signed)
Procedure Name: LMA Insertion Date/Time: 11/21/2017 11:56 AM Performed by: Eben Burow, CRNA Pre-anesthesia Checklist: Patient identified, Emergency Drugs available, Suction available, Patient being monitored and Timeout performed Patient Re-evaluated:Patient Re-evaluated prior to induction Oxygen Delivery Method: Circle system utilized Preoxygenation: Pre-oxygenation with 100% oxygen Induction Type: IV induction Ventilation: Mask ventilation without difficulty LMA: LMA inserted LMA Size: 4.0 Number of attempts: 1 Placement Confirmation: positive ETCO2 and breath sounds checked- equal and bilateral Tube secured with: Tape Dental Injury: Teeth and Oropharynx as per pre-operative assessment

## 2017-11-21 NOTE — OR Nursing (Signed)
Patient given ephedrine 10mg  iv for low B/P per dr Andree Elk order

## 2017-11-21 NOTE — Transfer of Care (Signed)
Immediate Anesthesia Transfer of Care Note  Patient: Joshua Drake  Procedure(s) Performed: OPEN REDUCTION INTERNAL FIXATION (ORIF) DISTAL RADIAL FRACTURE (Right Wrist)  Patient Location: PACU  Anesthesia Type:General  Level of Consciousness: drowsy  Airway & Oxygen Therapy: Patient Spontanous Breathing and Patient connected to face mask oxygen  Post-op Assessment: Report given to RN and Post -op Vital signs reviewed and stable  Post vital signs: Reviewed and stable  Last Vitals:  Vitals:   11/21/17 1116 11/21/17 1257  BP: 117/81   Pulse: 75   Resp: 18   Temp: 36.6 C 37.1 C  SpO2: 98%     Last Pain:  Vitals:   11/21/17 1116  TempSrc: Oral  PainSc: 0-No pain         Complications: No apparent anesthesia complications

## 2017-11-21 NOTE — H&P (Signed)
Reviewed paper H+P, will be scanned into chart. No changes noted.  

## 2017-11-22 ENCOUNTER — Encounter: Payer: Self-pay | Admitting: Orthopedic Surgery

## 2017-11-28 NOTE — Anesthesia Postprocedure Evaluation (Signed)
Anesthesia Post Note  Patient: SHEPARD KELTZ  Procedure(s) Performed: OPEN REDUCTION INTERNAL FIXATION (ORIF) DISTAL RADIAL FRACTURE (Right Wrist)  Patient location during evaluation: PACU Anesthesia Type: General Level of consciousness: awake and alert Pain management: pain level controlled Vital Signs Assessment: post-procedure vital signs reviewed and stable Respiratory status: spontaneous breathing, nonlabored ventilation, respiratory function stable and patient connected to nasal cannula oxygen Cardiovascular status: blood pressure returned to baseline and stable Postop Assessment: no apparent nausea or vomiting Anesthetic complications: no     Last Vitals:  Vitals:   11/21/17 1433 11/21/17 1500  BP: 134/67 113/67  Pulse: 77 65  Resp: 16 16  Temp: 36.6 C   SpO2: 96% 97%    Last Pain:  Vitals:   11/22/17 0819  TempSrc:   PainSc: 0-No pain                 Molli Barrows

## 2017-12-20 IMAGING — US US ABDOMINAL AORTA SCREENING AAA
1 series · 14 of 25 positions shown · non-contrast
Comparison: None.

CLINICAL DATA: Male between 65-75 years of age with a smoking
history.

EXAM:
US ABDOMINAL AORTA MEDICARE SCREENING
TECHNIQUE: Ultrasound examination of the abdominal aorta was performed as a
screening evaluation for abdominal aortic aneurysm.

[Series 1: us abdominal aorta screening aaa · 0.31mm/px · 14 of 32 slices shown]
[im 1/32]
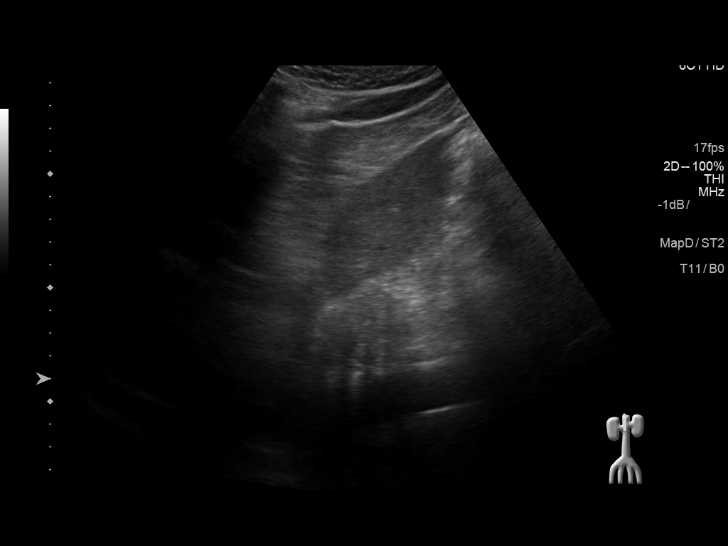
[im 3/32]
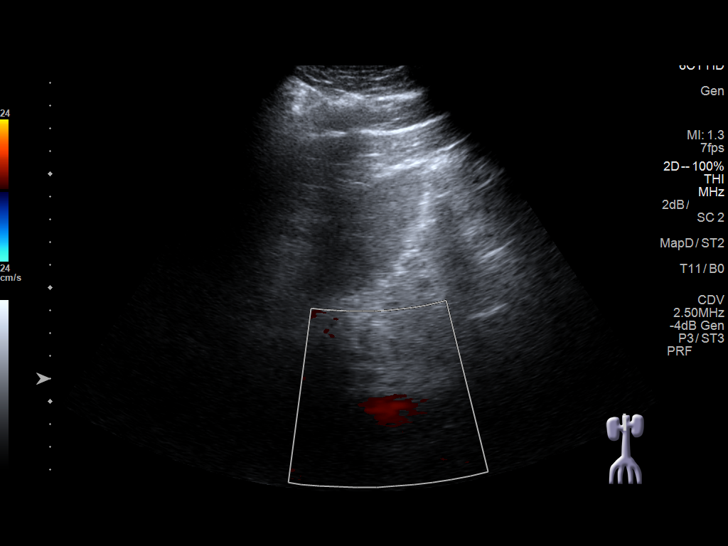
[im 6/32]
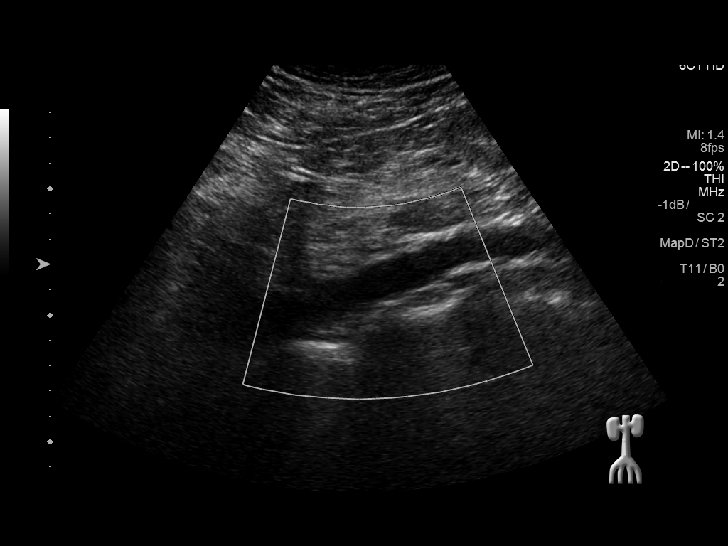
[im 8/32]
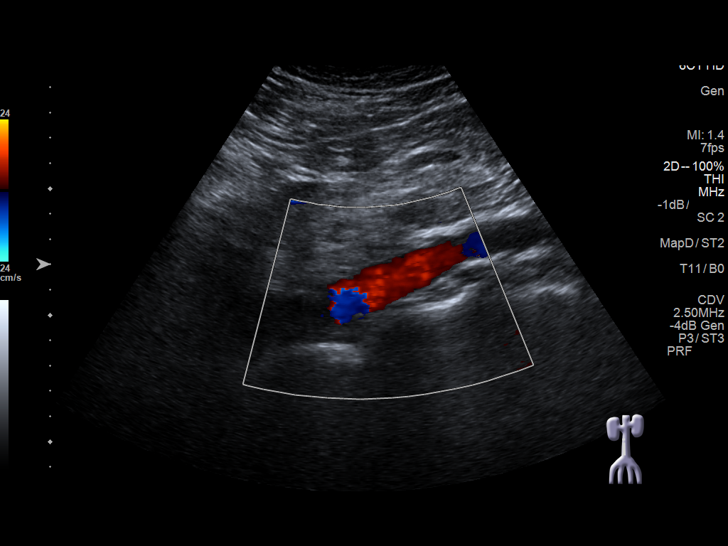
[im 11/32]
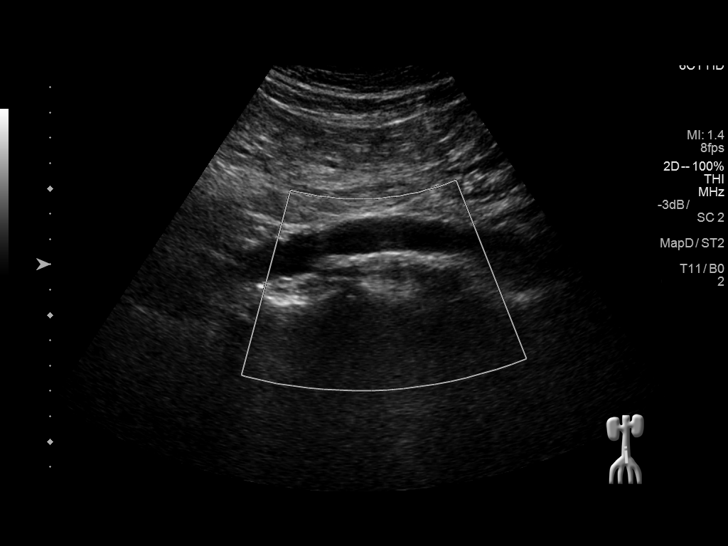
[im 12/32]
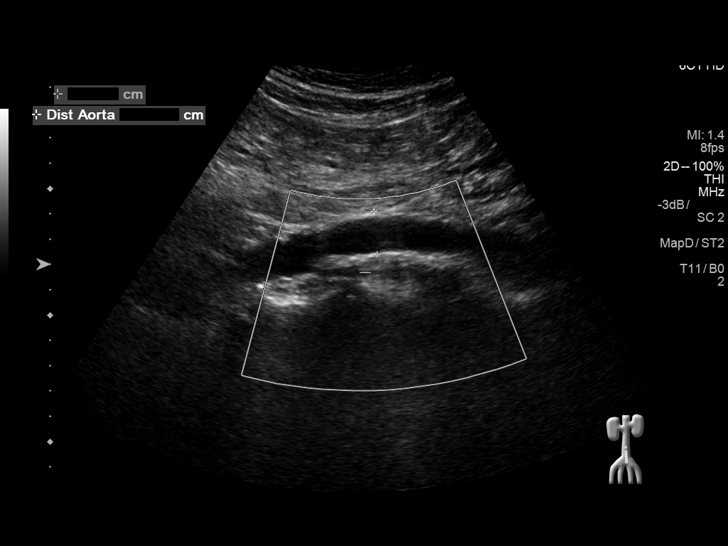
[im 15/32]
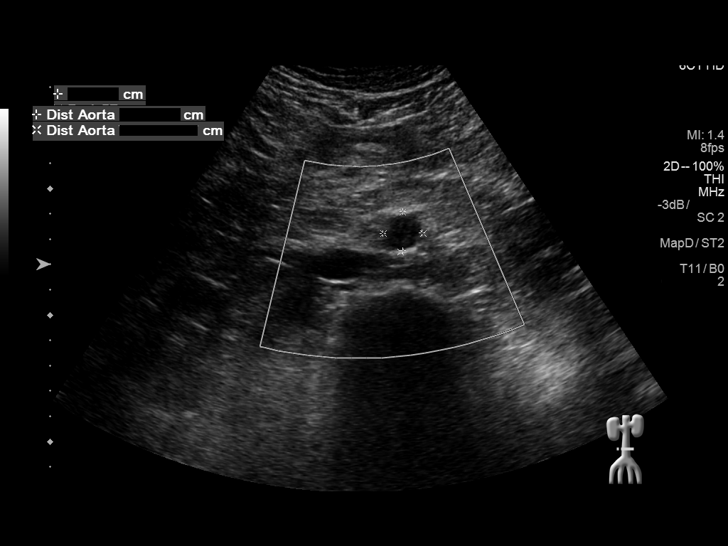
[im 17/32]
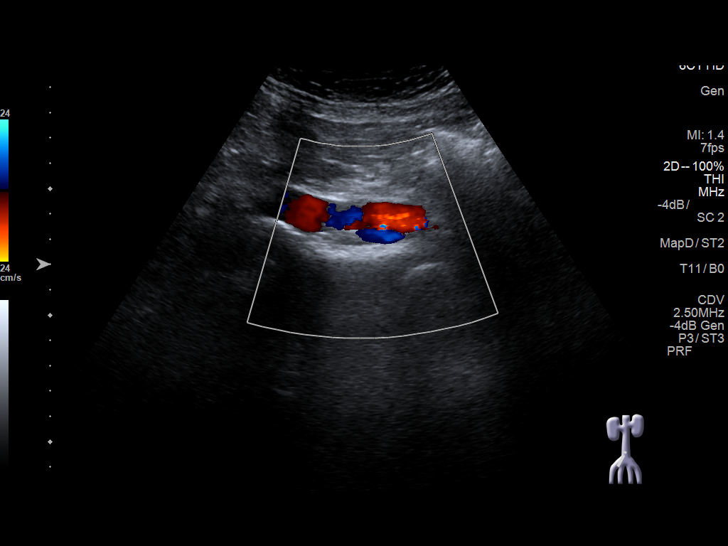
[im 20/32]
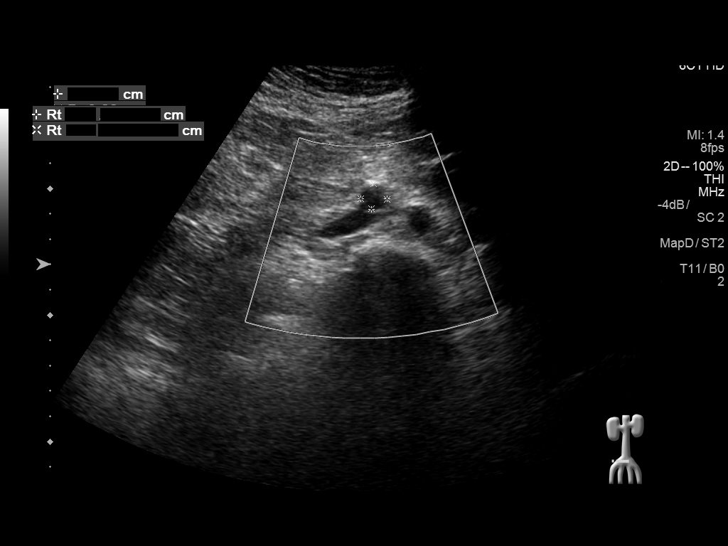
[im 21/32]
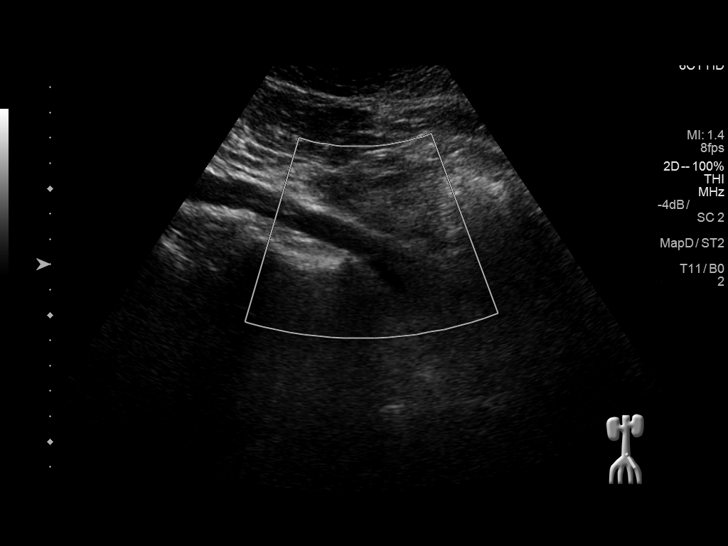
[im 24/32]
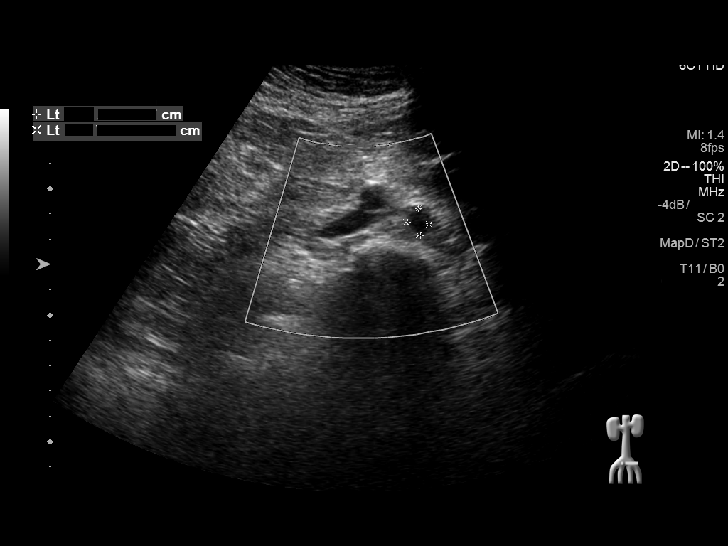
[im 26/32]
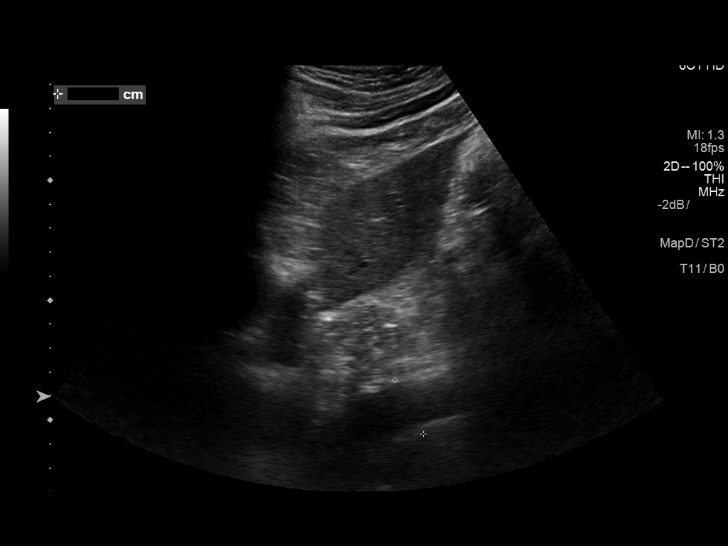
[im 29/32]
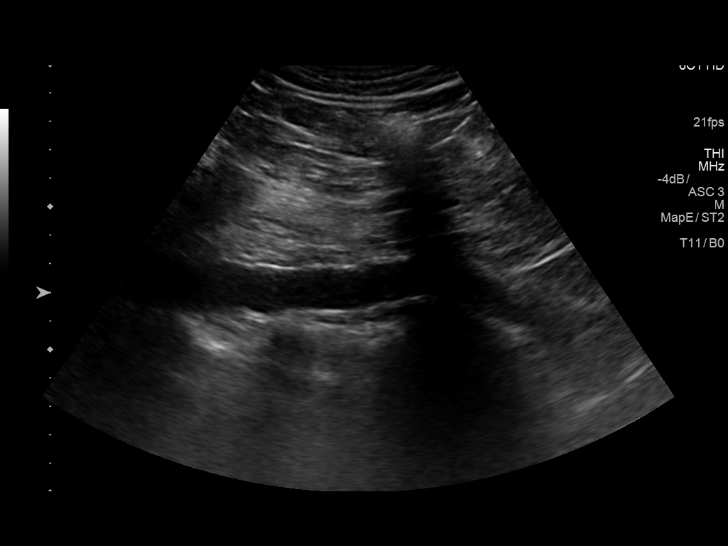
[im 32/32]
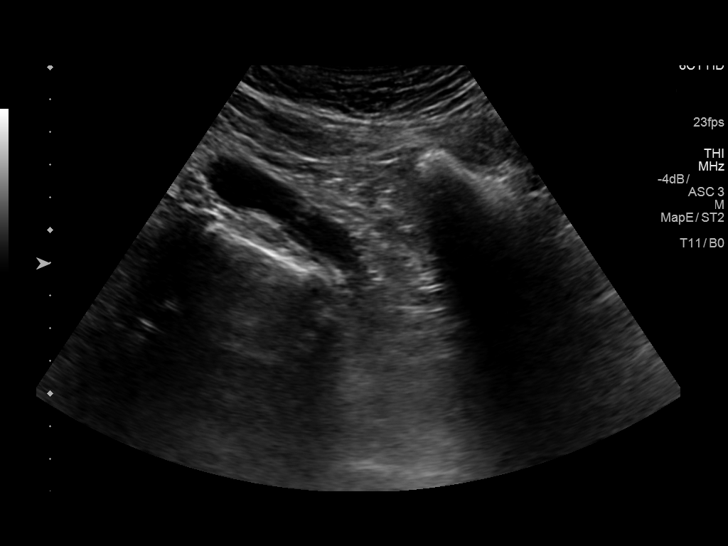

[14 of 25 positions shown; findings below may reference images not displayed]

FINDINGS: No evidence of abdominal aortic aneurysm measurements as follows:

ABDOMINAL AORTA

Proximal:  2.6 cm

Mid:  1.8 cm

Distal:  1.5 cm
IMPRESSION: No evidence of abdominal aortic aneurysm.

## 2018-01-08 ENCOUNTER — Other Ambulatory Visit: Payer: Self-pay | Admitting: Family Medicine

## 2018-01-08 ENCOUNTER — Other Ambulatory Visit: Payer: Medicare PPO

## 2018-01-08 DIAGNOSIS — R972 Elevated prostate specific antigen [PSA]: Secondary | ICD-10-CM

## 2018-01-09 ENCOUNTER — Other Ambulatory Visit: Payer: Self-pay | Admitting: Urology

## 2018-01-09 DIAGNOSIS — R972 Elevated prostate specific antigen [PSA]: Secondary | ICD-10-CM

## 2018-01-09 LAB — PSA: Prostate Specific Ag, Serum: 7.3 ng/mL — ABNORMAL HIGH (ref 0.0–4.0)

## 2018-01-10 ENCOUNTER — Encounter: Payer: Self-pay | Admitting: Urology

## 2018-01-10 ENCOUNTER — Telehealth: Payer: Self-pay | Admitting: Family Medicine

## 2018-01-10 ENCOUNTER — Ambulatory Visit (INDEPENDENT_AMBULATORY_CARE_PROVIDER_SITE_OTHER): Payer: Medicare PPO | Admitting: Urology

## 2018-01-10 VITALS — BP 172/98 | HR 71 | Ht 65.0 in | Wt 184.5 lb

## 2018-01-10 DIAGNOSIS — R972 Elevated prostate specific antigen [PSA]: Secondary | ICD-10-CM | POA: Diagnosis not present

## 2018-01-10 NOTE — Telephone Encounter (Signed)
Patient notified

## 2018-01-10 NOTE — Telephone Encounter (Signed)
-----   Message from Abbie Sons, MD sent at 01/09/2018  8:17 PM EDT ----- Repeat PSA has increased to 7.3.  Recommend scheduling prostate MRI.  Order was entered.

## 2018-01-10 NOTE — Progress Notes (Signed)
01/10/2018 11:39 AM   Joshua Drake 10-10-1945 [id number]  Referring provider: Dion Body, MD [facility] [place], [place] [address]  Chief Complaint  Patient presents with  . Elevated PSA   Urologic problem list: -Elevated PSA  Previous prostate biopsy May 2013 for an uncorrected PSA of 12.2 with benign pathology.  Prostate volume 81 cc.  On Avodart once weekly since 2009.  Most recent PSA is have been in the 4-6 range.    -BPH with lower urinary tract symptoms  TUMT by Dr. Eliberto Ivory 2009; placed on Avodart post procedure  HPI: 72 year old male presents for follow-up.  He was last seen November 2018 and PSA at that visit was 6.5.  A repeat PSA performed earlier this week was slightly higher at 7.3.  He has stable lower urinary tract symptoms which are not bothersome.  He remains on Avodart once weekly.   PMH: Past Medical History:  Diagnosis Date  . Allergic rhinitis   . BPH (benign prostatic hyperplasia)   . GERD (gastroesophageal reflux disease)   . Hyperlipidemia   . Hypertension   . Pre-diabetes     Surgical History: Past Surgical History:  Procedure Laterality Date  . CHOLECYSTECTOMY    . COLONOSCOPY WITH PROPOFOL N/A 04/06/2015   Procedure: COLONOSCOPY WITH PROPOFOL;  Surgeon: [name], MD;  Location: [facility] ENDOSCOPY;  Service: Gastroenterology;  Laterality: N/A;  . microwave procedure     microwave procedure for BPH  . OPEN REDUCTION INTERNAL FIXATION (ORIF) DISTAL RADIAL FRACTURE Right 11/21/2017   Procedure: OPEN REDUCTION INTERNAL FIXATION (ORIF) DISTAL RADIAL FRACTURE;  Surgeon: [name], MD;  Location: [facility] ORS;  Service: Orthopedics;  Laterality: Right;    Home Medications:  Allergies as of 01/10/2018   No Known Allergies     Medication List        Accurate as of 01/10/18 11:39 AM. Always use your most recent med list.          acetaminophen 500 MG tablet Commonly known as:  TYLENOL Take 500 mg by mouth daily  as needed for moderate pain.   aspirin 81 MG tablet Take 81 mg by mouth daily.   atorvastatin 10 MG tablet Commonly known as:  LIPITOR Take 10 mg by mouth daily.   dutasteride 0.5 MG capsule Commonly known as:  AVODART Take 1 capsule (0.5 mg total) by mouth daily.   esomeprazole 40 MG capsule Commonly known as:  NEXIUM Take 40 mg by mouth daily as needed (acid reflux).   ibuprofen 200 MG tablet Commonly known as:  ADVIL,MOTRIN Take 400 mg by mouth daily as needed for moderate pain.   lisinopril-hydrochlorothiazide 10-12.5 MG tablet Commonly known as:  PRINZIDE,ZESTORETIC Take 1 tablet by mouth daily.   traMADol 50 MG tablet Commonly known as:  ULTRAM Take 50 mg by mouth every 6 (six) hours as needed for moderate pain.       Allergies: No Known Allergies  Family History: Family History  Problem Relation Age of Onset  . Benign prostatic hyperplasia Father   . Benign prostatic hyperplasia Brother   . Kidney cancer Neg Hx   . Kidney disease Neg Hx   . Prostate cancer Neg Hx     Social History:  reports that he quit smoking about 54 years ago. His smoking use included cigarettes. He has a 2.00 pack-year smoking history. He has quit using smokeless tobacco. He reports that he does not drink alcohol or use drugs.  ROS: UROLOGY  Frequent Urination?: No Hard to postpone urination?: No Burning/pain with urination?: No Get up at night to urinate?: No Leakage of urine?: No Urine stream starts and stops?: No Trouble starting stream?: No Do you have to strain to urinate?: No Blood in urine?: No Urinary tract infection?: No Sexually transmitted disease?: No Injury to kidneys or bladder?: No Painful intercourse?: No Weak stream?: No Erection problems?: No Penile pain?: No  Gastrointestinal Nausea?: No Vomiting?: No Indigestion/heartburn?: No Diarrhea?: No Constipation?: No  Constitutional Fever: No Night sweats?: No Weight loss?: No Fatigue?:  No  Skin Skin rash/lesions?: No Itching?: No  Eyes Blurred vision?: No Double vision?: No  Ears/Nose/Throat Sore throat?: No Sinus problems?: No  Hematologic/Lymphatic Swollen glands?: No Easy bruising?: No  Cardiovascular Leg swelling?: No Chest pain?: No  Respiratory Cough?: No Shortness of breath?: No  Endocrine Excessive thirst?: No  Musculoskeletal Back pain?: No Joint pain?: No  Neurological Headaches?: No Dizziness?: No  Psychologic Depression?: No Anxiety?: No  Physical Exam: BP (!) 172/98 (BP Location: Right Arm, Patient Position: Sitting, Cuff Size: Normal)   Pulse 71   Ht 5\' 5"  (1.651 m)   Wt 184 lb 8 oz (83.7 kg)   BMI 30.70 kg/m   Constitutional:  Alert and oriented, No acute distress. HEENT: [place] AT, moist mucus membranes.  Trachea midline, no masses. Cardiovascular: No clubbing, cyanosis, or edema. Respiratory: Normal respiratory effort, no increased work of breathing. GI: Abdomen is soft, nontender, nondistended, no abdominal masses GU: No CVA tenderness prostate 60 g, smooth without nodules. Lymph: No cervical or inguinal lymphadenopathy. Skin: No rashes, bruises or suspicious lesions. Neurologic: Grossly intact, no focal deficits, moving all 4 extremities. Psychiatric: Normal mood and affect.  Laboratory Data: Lab Results  Component Value Date   WBC 6.8 11/17/2017   HGB 14.8 11/17/2017   HCT 43.6 11/17/2017   MCV 92.2 11/17/2017   PLT 185 11/17/2017    Lab Results  Component Value Date   CREATININE 0.89 11/17/2017    Assessment & Plan:   72 year old male with an elevated PSA and previous benign biopsy at 12.2.  His PSA trended downward and has recently increased.  I recommended scheduling a prostate MRI.  He will be notified with results and further recommendations.   Abbie Sons, [place] [address] [place], [place] [address] [phone]

## 2018-01-16 NOTE — Telephone Encounter (Signed)
Pt called to inquire about MRI Prostate. Advised pt that it is under review by his insurance company & once approved the MRI can be scheduled. Pt voices understanding.

## 2018-01-23 ENCOUNTER — Telehealth: Payer: Self-pay | Admitting: Urology

## 2018-01-23 NOTE — Telephone Encounter (Signed)
Pending medical review with patient's insurance   [place]

## 2018-01-23 NOTE — Telephone Encounter (Signed)
-----   Message from Abbie Sons, MD sent at 01/09/2018  8:17 PM EDT ----- Repeat PSA has increased to 7.3.  Recommend scheduling prostate MRI.  Order was entered.

## 2018-01-29 ENCOUNTER — Other Ambulatory Visit: Payer: Self-pay

## 2018-01-29 ENCOUNTER — Ambulatory Visit: Payer: Medicare PPO | Attending: Orthopedic Surgery | Admitting: Occupational Therapy

## 2018-01-29 DIAGNOSIS — R600 Localized edema: Secondary | ICD-10-CM

## 2018-01-29 DIAGNOSIS — R208 Other disturbances of skin sensation: Secondary | ICD-10-CM | POA: Insufficient documentation

## 2018-01-29 DIAGNOSIS — M25641 Stiffness of right hand, not elsewhere classified: Secondary | ICD-10-CM

## 2018-01-29 NOTE — Therapy (Signed)
[facility] [address], [place], [address] Phone: [phone]   Fax:  [phone]  Occupational Therapy screen  Patient Details  Name: ALVER LEETE MRN: [id number] Date of Birth: [birth year] No data recorded  Encounter Date: 01/29/2018    Past Medical History:  Diagnosis Date  . Allergic rhinitis   . BPH (benign prostatic hyperplasia)   . GERD (gastroesophageal reflux disease)   . Hyperlipidemia   . Hypertension   . Pre-diabetes     Past Surgical History:  Procedure Laterality Date  . CHOLECYSTECTOMY    . COLONOSCOPY WITH PROPOFOL N/A 04/06/2015   Procedure: COLONOSCOPY WITH PROPOFOL;  Surgeon: [name], MD;  Location: [facility] ENDOSCOPY;  Service: Gastroenterology;  Laterality: N/A;  . microwave procedure     microwave procedure for BPH  . OPEN REDUCTION INTERNAL FIXATION (ORIF) DISTAL RADIAL FRACTURE Right 11/21/2017   Procedure: OPEN REDUCTION INTERNAL FIXATION (ORIF) DISTAL RADIAL FRACTURE;  Surgeon: [name], MD;  Location: [facility] ORS;  Service: Orthopedics;  Laterality: Right;    There were no vitals filed for this visit.  Subjective Assessment - 01/29/18 1502    Subjective   I seen the Dr -and still having some issues with swellilng , numbness at times and pain in middle finger and palm - using my R hand - see if there are anything I can do at home     Currently in Pain?  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  Pt had increase edema still in hand and wrist on R   Pt ed on doing HEP for contrast - 2 x day  isotoner glove for night time Tendon glides and med N glides - 2 x day   wife to do some carpal tunnel spreads   And discuss with pt using padded gloves for lawn care - vibration  tools and avoid tight and sustained grip - built up handles  And to phone me or follow up in 2 wks if needed                                  Patient will benefit from skilled therapeutic intervention in order to improve the following deficits and impairments:     Visit Diagnosis: Localized edema  Other disturbances of skin sensation  Stiffness of right hand, not elsewhere classified    Problem List Patient Active Problem List   Diagnosis Date Noted  . Elevated PSA 07/13/2017  . Benign prostatic hyperplasia with lower urinary tract symptoms 07/13/2017    Rosalyn Gess OTR/L,CLT 01/29/2018, 3:05 PM  [facility] [address], [place], [address] Phone: [phone]   Fax:  [phone]  Name: SAHAS SLUKA MRN: [id number] Date of Birth: 1946-06-04

## 2018-02-15 ENCOUNTER — Ambulatory Visit
Admission: RE | Admit: 2018-02-15 | Discharge: 2018-02-15 | Disposition: A | Discharge status: Home / Self Care | Payer: Medicare PPO | Source: Ambulatory Visit | Attending: Urology | Admitting: Urology

## 2018-02-15 DIAGNOSIS — R972 Elevated prostate specific antigen [PSA]: Secondary | ICD-10-CM

## 2018-02-15 MED ORDER — GADOBENATE DIMEGLUMINE 529 MG/ML IV SOLN
17.0000 mL | Freq: Once | INTRAVENOUS | Status: AC | PRN
  Administered 2018-02-15: 17 mL via INTRAVENOUS

## 2018-02-19 ENCOUNTER — Telehealth: Payer: Self-pay

## 2018-02-19 NOTE — Telephone Encounter (Signed)
-----   Message from Abbie Sons, MD sent at 02/19/2018  2:51 PM EDT ----- Prostate MRI showed no abnormality suspicious for high-grade cancer.  Recommend follow-up PSA/DRE 6 months

## 2018-02-19 NOTE — Telephone Encounter (Signed)
Called pt, no answer. LM for pt informing him of information below. Pt to call back for questions or concerns.

## 2018-07-20 ENCOUNTER — Encounter: Payer: Self-pay | Admitting: Urology

## 2018-11-28 ENCOUNTER — Telehealth: Payer: Self-pay | Admitting: *Deleted

## 2018-11-28 MED ORDER — DUTASTERIDE 0.5 MG PO CAPS: 0.5000 mg | ORAL_CAPSULE | Freq: Every day | ORAL | 1 refills | Status: DC

## 2018-11-28 NOTE — Telephone Encounter (Signed)
error 

## 2019-03-18 DIAGNOSIS — E6609 Other obesity due to excess calories: Secondary | ICD-10-CM | POA: Insufficient documentation

## 2019-05-13 ENCOUNTER — Other Ambulatory Visit: Payer: Self-pay | Admitting: Family Medicine

## 2019-05-13 DIAGNOSIS — R972 Elevated prostate specific antigen [PSA]: Secondary | ICD-10-CM

## 2019-05-14 ENCOUNTER — Other Ambulatory Visit: Payer: Medicare PPO

## 2019-05-14 ENCOUNTER — Other Ambulatory Visit: Payer: Self-pay

## 2019-05-14 DIAGNOSIS — R972 Elevated prostate specific antigen [PSA]: Secondary | ICD-10-CM

## 2019-05-15 LAB — PSA: Prostate Specific Ag, Serum: 6.4 ng/mL — ABNORMAL HIGH (ref 0.0–4.0)

## 2019-05-21 ENCOUNTER — Ambulatory Visit: Payer: Medicare PPO | Admitting: Urology

## 2019-05-22 ENCOUNTER — Encounter: Payer: Self-pay | Admitting: Urology

## 2019-05-22 ENCOUNTER — Other Ambulatory Visit: Payer: Self-pay

## 2019-05-22 ENCOUNTER — Ambulatory Visit (INDEPENDENT_AMBULATORY_CARE_PROVIDER_SITE_OTHER): Payer: Medicare PPO | Admitting: Urology

## 2019-05-22 VITALS — BP 153/87 | HR 87 | Ht 66.0 in | Wt 183.0 lb

## 2019-05-22 DIAGNOSIS — R7303 Prediabetes: Secondary | ICD-10-CM | POA: Insufficient documentation

## 2019-05-22 DIAGNOSIS — I10 Essential (primary) hypertension: Secondary | ICD-10-CM | POA: Insufficient documentation

## 2019-05-22 DIAGNOSIS — K219 Gastro-esophageal reflux disease without esophagitis: Secondary | ICD-10-CM | POA: Insufficient documentation

## 2019-05-22 DIAGNOSIS — R972 Elevated prostate specific antigen [PSA]: Secondary | ICD-10-CM

## 2019-05-22 DIAGNOSIS — E7800 Pure hypercholesterolemia, unspecified: Secondary | ICD-10-CM | POA: Insufficient documentation

## 2019-05-22 DIAGNOSIS — N401 Enlarged prostate with lower urinary tract symptoms: Secondary | ICD-10-CM

## 2019-05-22 NOTE — Progress Notes (Signed)
05/22/2019 2:00 PM   KHRYSTOPHER Drake [birth year] [id number]  Referring provider: Dion Body, MD [facility] [name],  [place] [address]  Chief Complaint  Patient presents with  . Elevated PSA    Urologic history:  1.  Elevated PSA  -Biopsy 01/2012; uncorrected PSA 12.2  -Volume 81 cc; benign  -Recent PSAs 4-6 range  2.  BPH with LUTS  -TUMT Dr. Yves Dill 2009  -Placed on Avodart post procedure  -Only taking once weekly   HPI: 73 y.o. male presents for annual follow-up.  He denies bothersome lower urinary tract symptoms.  He continues taking dutasteride 1 time weekly.  Uncorrected PSA drawn 05/14/2019 was stable at 6.2.  He has no difficulty achieving an erection however he states occasionally when he has to back off to get in sync with his partner he will lose the erection.  He was inquiring if this is common or signify any abnormalities.   PMH: Past Medical History:  Diagnosis Date  . Allergic rhinitis   . BPH (benign prostatic hyperplasia)   . GERD (gastroesophageal reflux disease)   . Hyperlipidemia   . Hypertension   . Pre-diabetes     Surgical History: Past Surgical History:  Procedure Laterality Date  . CHOLECYSTECTOMY    . COLONOSCOPY WITH PROPOFOL N/A 04/06/2015   Procedure: COLONOSCOPY WITH PROPOFOL;  Surgeon: [name], MD;  Location: [facility] ENDOSCOPY;  Service: Gastroenterology;  Laterality: N/A;  . microwave procedure     microwave procedure for BPH  . OPEN REDUCTION INTERNAL FIXATION (ORIF) DISTAL RADIAL FRACTURE Right 11/21/2017   Procedure: OPEN REDUCTION INTERNAL FIXATION (ORIF) DISTAL RADIAL FRACTURE;  Surgeon: [name], MD;  Location: [facility] ORS;  Service: Orthopedics;  Laterality: Right;    Home Medications:  Allergies as of 05/22/2019   No Known Allergies     Medication List       Accurate as of May 22, 2019  2:00 PM. If you have any questions, ask your nurse or doctor.        STOP taking these  medications   acetaminophen 500 MG tablet Commonly known as: TYLENOL Stopped by: Abbie Sons, MD   aspirin 81 MG tablet Stopped by: Abbie Sons, MD   traMADol 50 MG tablet Commonly known as: ULTRAM Stopped by: Abbie Sons, MD     TAKE these medications   atorvastatin 10 MG tablet Commonly known as: LIPITOR Take 10 mg by mouth daily.   dutasteride 0.5 MG capsule Commonly known as: AVODART Take 1 capsule (0.5 mg total) by mouth daily. What changed: when to take this   esomeprazole 40 MG capsule Commonly known as: NEXIUM Take 40 mg by mouth daily as needed (acid reflux).   ibuprofen 200 MG tablet Commonly known as: ADVIL Take 400 mg by mouth daily as needed for moderate pain.   lisinopril-hydrochlorothiazide 10-12.5 MG tablet Commonly known as: ZESTORETIC Take 1 tablet by mouth daily.   pantoprazole 20 MG tablet Commonly known as: PROTONIX       Allergies: No Known Allergies  Family History: Family History  Problem Relation Age of Onset  . Benign prostatic hyperplasia Father   . Benign prostatic hyperplasia Brother   . Kidney cancer Neg Hx   . Kidney disease Neg Hx   . Prostate cancer Neg Hx     Social History:  reports that he quit smoking about 55 years ago. His smoking use included cigarettes. He has a 2.00 pack-year smoking history.  He has quit using smokeless tobacco. He reports that he does not drink alcohol or use drugs.  ROS: UROLOGY Frequent Urination?: No Hard to postpone urination?: No Burning/pain with urination?: No Get up at night to urinate?: No Leakage of urine?: No Urine stream starts and stops?: No Trouble starting stream?: No Do you have to strain to urinate?: No Blood in urine?: No Urinary tract infection?: No Sexually transmitted disease?: No Injury to kidneys or bladder?: No Painful intercourse?: No Weak stream?: No Erection problems?: No Penile pain?: No  Gastrointestinal Nausea?: No Vomiting?: No  Indigestion/heartburn?: No Diarrhea?: No Constipation?: No  Constitutional Fever: No Night sweats?: No Weight loss?: No Fatigue?: No  Skin Skin rash/lesions?: No Itching?: No  Eyes Blurred vision?: No Double vision?: No  Ears/Nose/Throat Sore throat?: No Sinus problems?: No  Hematologic/Lymphatic Swollen glands?: No Easy bruising?: No  Cardiovascular Leg swelling?: No Chest pain?: No  Respiratory Cough?: No Shortness of breath?: No  Endocrine Excessive thirst?: No  Musculoskeletal Back pain?: No Joint pain?: No  Neurological Headaches?: No Dizziness?: No  Psychologic Depression?: No Anxiety?: No  Physical Exam: BP (!) 153/87 (BP Location: Left Arm, Patient Position: Sitting, Cuff Size: Normal)   Pulse 87   Ht 5\' 6"  (1.676 m)   Wt 183 lb (83 kg)   BMI 29.54 kg/m   Constitutional:  Alert and oriented, No acute distress. HEENT: [place] AT, moist mucus membranes.  Trachea midline, no masses. Cardiovascular: No clubbing, cyanosis, or edema. Respiratory: Normal respiratory effort, no increased work of breathing. GU: Prostate 60 g, smooth without nodules Skin: No rashes, bruises or suspicious lesions. Neurologic: Grossly intact, no focal deficits, moving all 4 extremities. Psychiatric: Normal mood and affect.   Assessment & Plan:    - Elevated PSA Stable PSA/DRE.  He will continue annual follow-up.  - BPH with lower urinary tract symptoms Mild voiding symptoms on a minimal dose of dutasteride.  Dutasteride was refilled   - Erectile dysfunction Unlikely pathologic based on the presentation.  If this is a problem I recommended a trial of a venous compression band.  Abbie Sons, [place] [address], [place], [place] [address] [phone]

## 2019-05-25 MED ORDER — DUTASTERIDE 0.5 MG PO CAPS: 0.5000 mg | ORAL_CAPSULE | Freq: Every day | ORAL | 1 refills | Status: DC

## 2019-09-18 ENCOUNTER — Other Ambulatory Visit: Payer: Self-pay | Admitting: Family Medicine

## 2020-04-22 ENCOUNTER — Ambulatory Visit: Payer: Medicare PPO | Admitting: Urology

## 2020-04-24 ENCOUNTER — Other Ambulatory Visit: Payer: Self-pay

## 2020-04-24 ENCOUNTER — Encounter: Payer: Self-pay | Admitting: Urology

## 2020-04-24 ENCOUNTER — Ambulatory Visit (INDEPENDENT_AMBULATORY_CARE_PROVIDER_SITE_OTHER): Payer: Medicare PPO | Admitting: Urology

## 2020-04-24 VITALS — BP 134/82 | HR 82 | Ht 66.0 in | Wt 183.0 lb

## 2020-04-24 DIAGNOSIS — R310 Gross hematuria: Secondary | ICD-10-CM | POA: Diagnosis not present

## 2020-04-24 LAB — URINALYSIS, COMPLETE
Bilirubin, UA: NEGATIVE
Glucose, UA: NEGATIVE
Ketones, UA: NEGATIVE
Leukocytes,UA: NEGATIVE
Nitrite, UA: NEGATIVE
Protein,UA: NEGATIVE
Specific Gravity, UA: 1.015 (ref 1.005–1.030)
Urobilinogen, Ur: 0.2 mg/dL (ref 0.2–1.0)
pH, UA: 6 (ref 5.0–7.5)

## 2020-04-24 LAB — MICROSCOPIC EXAMINATION: Bacteria, UA: NONE SEEN

## 2020-04-24 NOTE — Progress Notes (Signed)
04/24/2020 9:33 AM   Joshua Drake 1946-02-07 [id number]  Referring provider: Dion Body, MD [facility] [place],  [place] [address]  Chief Complaint  Patient presents with  . Hematuria    Urologic history:  1.  Elevated PSA             -Biopsy 01/2012; uncorrected PSA 12.2             -Volume 81 cc; benign             -Recent PSAs 4-6 range  2.  BPH with LUTS             -TUMT Dr. Yves Dill 2009             -Placed on Avodart post procedure             -Only taking once weekly  HPI: 74 y.o. male scheduled for annual follow-up September 2021 however he called to move appointment up due to recent onset of hematuria.   04/17/2020 voided and states the urine in the toilet was almost black in appearance  Subsequent voids the initial urine stream was very dark then cleared  This persisted for 2-3 days then cleared  Urine has been completely clear the past 48 hours  Denied dysuria, bothersome LUTS  No flank, abdominal or pelvic pain  Saw PCP December 2020 with mild dysuria and UA showed >50 WBC/>50 RBC  Was treated with empiric antibiotics; urine culture negative  Has noted blood spotting on underwear   PMH: Past Medical History:  Diagnosis Date  . Allergic rhinitis   . BPH (benign prostatic hyperplasia)   . GERD (gastroesophageal reflux disease)   . Hyperlipidemia   . Hypertension   . Pre-diabetes     Surgical History: Past Surgical History:  Procedure Laterality Date  . CHOLECYSTECTOMY    . COLONOSCOPY WITH PROPOFOL N/A 04/06/2015   Procedure: COLONOSCOPY WITH PROPOFOL;  Surgeon: [name], MD;  Location: [facility] ENDOSCOPY;  Service: Gastroenterology;  Laterality: N/A;  . microwave procedure     microwave procedure for BPH  . OPEN REDUCTION INTERNAL FIXATION (ORIF) DISTAL RADIAL FRACTURE Right 11/21/2017   Procedure: OPEN REDUCTION INTERNAL FIXATION (ORIF) DISTAL RADIAL FRACTURE;  Surgeon: [name], MD;  Location: [facility]  ORS;  Service: Orthopedics;  Laterality: Right;    Home Medications:  Allergies as of 04/24/2020   No Known Allergies     Medication List       Accurate as of April 24, 2020  9:33 AM. If you have any questions, ask your nurse or doctor.        atorvastatin 10 MG tablet Commonly known as: LIPITOR Take 10 mg by mouth daily.   dutasteride 0.5 MG capsule Commonly known as: AVODART Take 1 capsule (0.5 mg total) by mouth daily.   esomeprazole 40 MG capsule Commonly known as: NEXIUM Take 40 mg by mouth daily as needed (acid reflux).   ibuprofen 200 MG tablet Commonly known as: ADVIL Take 400 mg by mouth daily as needed for moderate pain.   lisinopril-hydrochlorothiazide 10-12.5 MG tablet Commonly known as: ZESTORETIC Take 1 tablet by mouth daily.   pantoprazole 20 MG tablet Commonly known as: PROTONIX       Allergies: No Known Allergies  Family History: Family History  Problem Relation Age of Onset  . Benign prostatic hyperplasia Father   . Benign prostatic hyperplasia Brother   . Kidney cancer Neg Hx   . Kidney disease  Neg Hx   . Prostate cancer Neg Hx     Social History:  reports that he quit smoking about 56 years ago. His smoking use included cigarettes. He has a 2.00 pack-year smoking history. He has quit using smokeless tobacco. He reports that he does not drink alcohol and does not use drugs.   Physical Exam: BP 134/82   Pulse 82   Ht 5\' 6"  (1.676 m)   Wt 183 lb (83 kg)   BMI 29.54 kg/m   Constitutional:  Alert and oriented, No acute distress. HEENT: [place] AT, moist mucus membranes.  Trachea midline, no masses. Cardiovascular: No clubbing, cyanosis, or edema. Respiratory: Normal respiratory effort, no increased work of breathing. Skin: No rashes, bruises or suspicious lesions. Neurologic: Grossly intact, no focal deficits, moving all 4 extremities. Psychiatric: Normal mood and affect.  Laboratory Data:  Urinalysis Dipstick trace blood Microscopy  3-10 RBC   Assessment & Plan:    1.  Gross hematuria  New problem of uncertain prognosis  AUA hematuria risk stratification: High  We discussed the standard recommended evaluation of CT urogram and cystoscopy.  The procedures were discussed.  Hematuria most likely secondary to a lower tract source  He has elected to proceed with further evaluation and the studies will be scheduled   Abbie Sons, MD  [place] [address], [place], [place] [address] [phone]

## 2020-04-27 ENCOUNTER — Other Ambulatory Visit: Payer: Self-pay

## 2020-04-27 ENCOUNTER — Ambulatory Visit (INDEPENDENT_AMBULATORY_CARE_PROVIDER_SITE_OTHER): Payer: Medicare PPO | Admitting: Dermatology

## 2020-04-27 DIAGNOSIS — D485 Neoplasm of uncertain behavior of skin: Secondary | ICD-10-CM

## 2020-04-27 DIAGNOSIS — D239 Other benign neoplasm of skin, unspecified: Secondary | ICD-10-CM

## 2020-04-27 DIAGNOSIS — L570 Actinic keratosis: Secondary | ICD-10-CM | POA: Diagnosis not present

## 2020-04-27 DIAGNOSIS — D229 Melanocytic nevi, unspecified: Secondary | ICD-10-CM | POA: Diagnosis not present

## 2020-04-27 DIAGNOSIS — D489 Neoplasm of uncertain behavior, unspecified: Secondary | ICD-10-CM

## 2020-04-27 DIAGNOSIS — L578 Other skin changes due to chronic exposure to nonionizing radiation: Secondary | ICD-10-CM

## 2020-04-27 DIAGNOSIS — D2361 Other benign neoplasm of skin of right upper limb, including shoulder: Secondary | ICD-10-CM | POA: Diagnosis not present

## 2020-04-27 DIAGNOSIS — L821 Other seborrheic keratosis: Secondary | ICD-10-CM

## 2020-04-27 DIAGNOSIS — Z1283 Encounter for screening for malignant neoplasm of skin: Secondary | ICD-10-CM | POA: Diagnosis not present

## 2020-04-27 DIAGNOSIS — D1800 Hemangioma unspecified site: Secondary | ICD-10-CM

## 2020-04-27 DIAGNOSIS — L720 Epidermal cyst: Secondary | ICD-10-CM

## 2020-04-27 DIAGNOSIS — L814 Other melanin hyperpigmentation: Secondary | ICD-10-CM

## 2020-04-27 NOTE — Patient Instructions (Addendum)
Recommend daily broad spectrum sunscreen SPF 30+ to sun-exposed areas, reapply every 2 hours as needed. Call for new or changing lesions.  Prior to procedure, discussed risks of blister formation, small wound, skin dyspigmentation, or rare scar following cryotherapy.  Liquid nitrogen was applied for 10-12 seconds to the skin lesion and the expected blistering or scabbing reaction explained. Do not pick at the area. Patient reminded to expect hypopigmented scars from the procedure. Return if lesion fails to fully resolve.  Cryotherapy Aftercare  . Wash gently with soap and water everyday.   . Apply Vaseline and Band-Aid daily until healed.   Wound Care Instructions  1. Cleanse wound gently with soap and water once a day then pat dry with clean gauze. Apply a thing coat of Petrolatum (petroleum jelly, "Vaseline") over the wound (unless you have an allergy to this). We recommend that you use a new, sterile tube of Vaseline. Do not pick or remove scabs. Do not remove the yellow or white "healing tissue" from the base of the wound.  2. Cover the wound with fresh, clean, nonstick gauze and secure with paper tape. You may use Band-Aids in place of gauze and tape if the would is small enough, but would recommend trimming much of the tape off as there is often too much. Sometimes Band-Aids can irritate the skin.  3. You should call the office for your biopsy report after 1 week if you have not already been contacted.  4. If you experience any problems, such as abnormal amounts of bleeding, swelling, significant bruising, significant pain, or evidence of infection, please call the office immediately.  5. FOR ADULT SURGERY PATIENTS: If you need something for pain relief you may take 1 extra strength Tylenol (acetaminophen) AND 2 Ibuprofen (200mg each) together every 4 hours as needed for pain. (do not take these if you are allergic to them or if you have a reason you should not take them.) Typically, you  may only need pain medication for 1 to 3 days.     

## 2020-04-27 NOTE — Progress Notes (Signed)
Follow-Up Visit   Subjective  Joshua Drake is a 74 y.o. male who presents for the following: TBSE. The patient presents for Total-Body Skin Exam (TBSE) for skin cancer screening and mole check. Patient presents today for annual TBSE, patient has a few areas of concern on his left groin, states that it has been changing and getting irritated, and a spot on the right side of his nose. Patient does not have a h/o skin cancer  The following portions of the chart were reviewed this encounter and updated as appropriate:  Tobacco  Allergies  Meds  Problems  Med Hx  Surg Hx  Fam Hx     Review of Systems:  No other skin or systemic complaints except as noted in HPI or Assessment and Plan.  Objective  Well appearing patient in no apparent distress; mood and affect are within normal limits.  A full examination was performed including scalp, head, eyes, ears, nose, lips, neck, chest, axillae, abdomen, back, buttocks, bilateral upper extremities, bilateral lower extremities, hands, feet, fingers, toes, fingernails, and toenails. All findings within normal limits unless otherwise noted below.  Objective  Left Malar Cheek: Smooth white papule(s).   Objective  Right Deltoid x 2 (2): Firm pink/brown papulenodule with dimple sign.   Objective  Left Pubic area: 1.1 cm crusted brown papule  Objective  Right Nasal Sidewall: Erythematous thin papules/macules with gritty scale.    Assessment & Plan  Milia Left Malar Cheek  Benign, observe.    Dermatofibroma (2) Right Deltoid x 2  Benign, observe.    Neoplasm of uncertain behavior Left Pubic area  Epidermal / dermal shaving  Lesion diameter (cm):  1.1 Informed consent: discussed and consent obtained   Timeout: patient name, date of birth, surgical site, and procedure verified   Procedure prep:  Patient was prepped and draped in usual sterile fashion Prep type:  Isopropyl alcohol Anesthesia: the lesion was anesthetized in  a standard fashion   Anesthetic:  1% lidocaine w/ epinephrine 1-100,000 buffered w/ 8.4% NaHCO3 Instrument used: flexible razor blade   Hemostasis achieved with: pressure, aluminum chloride and electrodesiccation   Outcome: patient tolerated procedure well   Post-procedure details: sterile dressing applied and wound care instructions given   Dressing type: bandage and petrolatum    Specimen 1 - Surgical pathology Differential Diagnosis: nevus vs dysplastic vs other Check Margins: No 1.1 cm brown papule  Shave removal today  AK (actinic keratosis) Right Nasal Sidewall  Cryotherapy today Prior to procedure, discussed risks of blister formation, small wound, skin dyspigmentation, or rare scar following cryotherapy.    Destruction of lesion - Right Nasal Sidewall Complexity: simple   Destruction method: cryotherapy   Informed consent: discussed and consent obtained   Timeout:  patient name, date of birth, surgical site, and procedure verified Lesion destroyed using liquid nitrogen: Yes   Region frozen until ice ball extended beyond lesion: Yes   Outcome: patient tolerated procedure well with no complications   Post-procedure details: wound care instructions given     Lentigines - Scattered tan macules - Discussed due to sun exposure - Benign, observe - Call for any changes  Seborrheic Keratoses - Stuck-on, waxy, tan-brown papules and plaques  - Discussed benign etiology and prognosis. - Observe - Call for any changes  Melanocytic Nevi - Tan-brown and/or pink-flesh-colored symmetric macules and papules - Benign appearing on exam today - Observation - Call clinic for new or changing moles - Recommend daily use of broad spectrum spf 30+  sunscreen to sun-exposed areas.   Hemangiomas - Red papules - Discussed benign nature - Observe - Call for any changes  Actinic Damage - diffuse scaly erythematous macules with underlying dyspigmentation - Recommend daily broad  spectrum sunscreen SPF 30+ to sun-exposed areas, reapply every 2 hours as needed.  - Call for new or changing lesions.  Skin cancer screening performed today.  Return in about 1 year (around 04/27/2021) for TBSE.   I, Donzetta Kohut, CMA, am acting as scribe for Sarina Ser, MD . Documentation: I have reviewed the above documentation for accuracy and completeness, and I agree with the above.  Sarina Ser, MD

## 2020-04-30 ENCOUNTER — Telehealth: Payer: Self-pay

## 2020-04-30 NOTE — Telephone Encounter (Signed)
-----   Message from Ralene Bathe, MD sent at 04/30/2020 12:51 PM EDT ----- Skin , left pubic area SEBORRHEIC KERATOSIS, IRRITATED  Benign irritated keratosis No further treatment needed

## 2020-04-30 NOTE — Telephone Encounter (Signed)
Unable to leave a message.

## 2020-05-04 ENCOUNTER — Encounter: Payer: Self-pay | Admitting: Dermatology

## 2020-05-11 ENCOUNTER — Telehealth: Payer: Self-pay

## 2020-05-11 NOTE — Telephone Encounter (Signed)
Phone answered but then the phone hung up.

## 2020-05-11 NOTE — Telephone Encounter (Signed)
-----   Message from Ralene Bathe, MD sent at 04/30/2020 12:51 PM EDT ----- Skin , left pubic area SEBORRHEIC KERATOSIS, IRRITATED  Benign irritated keratosis No further treatment needed

## 2020-05-22 ENCOUNTER — Ambulatory Visit: Payer: Medicare PPO | Admitting: Urology

## 2020-05-26 ENCOUNTER — Telehealth: Payer: Self-pay

## 2020-05-26 NOTE — Telephone Encounter (Signed)
Patient informed of pathology results 

## 2020-05-26 NOTE — Telephone Encounter (Signed)
-----   Message from Ralene Bathe, MD sent at 04/30/2020 12:51 PM EDT ----- Skin , left pubic area SEBORRHEIC KERATOSIS, IRRITATED  Benign irritated keratosis No further treatment needed

## 2020-05-27 ENCOUNTER — Ambulatory Visit: Payer: Self-pay | Admitting: Urology

## 2020-05-29 ENCOUNTER — Ambulatory Visit
Admission: RE | Admit: 2020-05-29 | Discharge: 2020-05-29 | Disposition: A | Discharge status: Home / Self Care | Payer: Medicare PPO | Source: Ambulatory Visit | Attending: Urology | Admitting: Urology

## 2020-05-29 ENCOUNTER — Other Ambulatory Visit: Payer: Self-pay

## 2020-05-29 DIAGNOSIS — R310 Gross hematuria: Secondary | ICD-10-CM | POA: Diagnosis present

## 2020-05-29 LAB — POCT I-STAT CREATININE: Creatinine, Ser: 1 mg/dL (ref 0.61–1.24)

## 2020-05-29 MED ORDER — IOHEXOL 300 MG/ML  SOLN
125.0000 mL | Freq: Once | INTRAMUSCULAR | Status: AC | PRN
  Administered 2020-05-29: 125 mL via INTRAVENOUS

## 2020-06-03 ENCOUNTER — Other Ambulatory Visit: Payer: Self-pay | Admitting: Urology

## 2020-06-03 ENCOUNTER — Encounter: Payer: Self-pay | Admitting: Urology

## 2020-06-03 ENCOUNTER — Other Ambulatory Visit: Payer: Self-pay

## 2020-06-03 ENCOUNTER — Ambulatory Visit (INDEPENDENT_AMBULATORY_CARE_PROVIDER_SITE_OTHER): Payer: Medicare PPO | Admitting: Urology

## 2020-06-03 VITALS — BP 137/87 | HR 84 | Ht 68.0 in | Wt 184.0 lb

## 2020-06-03 DIAGNOSIS — R310 Gross hematuria: Secondary | ICD-10-CM

## 2020-06-04 LAB — URINALYSIS, COMPLETE
Bilirubin, UA: NEGATIVE
Glucose, UA: NEGATIVE
Ketones, UA: NEGATIVE
Leukocytes,UA: NEGATIVE
Nitrite, UA: NEGATIVE
Protein,UA: NEGATIVE
Specific Gravity, UA: 1.02 (ref 1.005–1.030)
Urobilinogen, Ur: 0.2 mg/dL (ref 0.2–1.0)
pH, UA: 6.5 (ref 5.0–7.5)

## 2020-06-04 LAB — MICROSCOPIC EXAMINATION: Bacteria, UA: NONE SEEN

## 2020-06-04 LAB — CYTOLOGY - NON PAP

## 2020-06-04 NOTE — Progress Notes (Signed)
   06/04/20  CC:  Chief Complaint  Patient presents with  . Cysto   Indication: Gross hematuria   HPI: No complaints today.  Denies recurrent gross hematuria  Blood pressure 137/87, pulse 84, height 5\' 8"  (1.727 m), weight 184 lb (83.5 kg). NED. A&Ox3.   No respiratory distress   Abd soft, NT, ND Normal phallus with bilateral descended testicles  CT urogram: Small, 3 mm left renal calculus   Cystoscopy Procedure Note  Patient identification was confirmed, informed consent was obtained, and patient was prepped using Betadine solution.  Lidocaine jelly was administered per urethral meatus.     Pre-Procedure: - Inspection reveals a normal caliber urethral meatus.  Procedure: The flexible cystoscope was introduced without difficulty - No urethral strictures/lesions are present. - Prominent lateral lobe enlargement with hypervascularity prostate  - Mild elevation bladder neck - Bilateral ureteral orifices identified - Bladder mucosa  reveals no ulcers, tumors, or lesions - No bladder stones -Mild trabeculation  Retroflexion shows backbleeding from prostate   Post-Procedure: - Patient tolerated the procedure well  Assessment/ Plan:  No significant mucosal abnormalities on cystoscopy  BPH and prominent hypervascularity which is most likely source of gross hematuria  Follow-up 6 months   Abbie Sons, MD

## 2020-06-05 ENCOUNTER — Telehealth: Payer: Self-pay | Admitting: *Deleted

## 2020-06-05 NOTE — Telephone Encounter (Signed)
-----   Message from Abbie Sons, MD sent at 06/05/2020  3:17 PM EDT ----- Urine cytology showed no cells suspicious for cancer. Follow-up as scheduled

## 2020-06-05 NOTE — Telephone Encounter (Signed)
Notified patient as instructed, patient pleased. Discussed follow-up appointments, patient agrees  

## 2020-06-17 ENCOUNTER — Ambulatory Visit: Payer: Self-pay | Admitting: Urology

## 2020-09-25 ENCOUNTER — Ambulatory Visit (INDEPENDENT_AMBULATORY_CARE_PROVIDER_SITE_OTHER): Payer: Medicare PPO | Admitting: Urology

## 2020-09-25 ENCOUNTER — Encounter: Payer: Self-pay | Admitting: Urology

## 2020-09-25 ENCOUNTER — Other Ambulatory Visit: Payer: Self-pay

## 2020-09-25 VITALS — BP 145/91 | HR 74 | Ht 68.0 in | Wt 186.0 lb

## 2020-09-25 DIAGNOSIS — N401 Enlarged prostate with lower urinary tract symptoms: Secondary | ICD-10-CM | POA: Diagnosis not present

## 2020-09-25 DIAGNOSIS — R310 Gross hematuria: Secondary | ICD-10-CM

## 2020-09-25 LAB — URINALYSIS, COMPLETE
Bilirubin, UA: NEGATIVE
Glucose, UA: NEGATIVE
Ketones, UA: NEGATIVE
Leukocytes,UA: NEGATIVE
Nitrite, UA: NEGATIVE
Protein,UA: NEGATIVE
Specific Gravity, UA: 1.02 (ref 1.005–1.030)
Urobilinogen, Ur: 1 mg/dL (ref 0.2–1.0)
pH, UA: 7 (ref 5.0–7.5)

## 2020-09-25 LAB — MICROSCOPIC EXAMINATION: Bacteria, UA: NONE SEEN

## 2020-09-25 NOTE — Progress Notes (Signed)
09/25/2020 11:57 AM   Joshua Drake Jul 13, 1946 [id number]  Referring provider: Dion Body, MD [facility] [place],  [place] [address]  Chief Complaint  Patient presents with  . Hematuria     1.Elevated PSA -Biopsy 01/2012; uncorrected PSA 12.2 -Volume 81 cc; benign -Recent PSAs4-6 range  2.BPH with LUTS -TUMT Dr. Yves Dill 2009 -Placed on Avodart post procedure   HPI: 75 y.o. male presents for follow-up at the recommendation of Dr. Netty Starring.   Gross hematuria evaluation 05/2020  CTU without significant upper tract abnormalities and cystoscopy with significant prostate enlargement with hypervascularity  Prostatic bleeding was the most likely source of his hematuria  On 12/29 he had intercourse and had onset of gross hematuria which lasted 2-3 days; recurrent intermittent hematuria the following week  Denies flank, abdominal, pelvic pain or worsening LUTS/dysuria   PMH: Past Medical History:  Diagnosis Date  . Actinic keratosis   . Allergic rhinitis   . BPH (benign prostatic hyperplasia)   . GERD (gastroesophageal reflux disease)   . Hyperlipidemia   . Hypertension   . Pre-diabetes     Surgical History: Past Surgical History:  Procedure Laterality Date  . CHOLECYSTECTOMY    . COLONOSCOPY WITH PROPOFOL N/A 04/06/2015   Procedure: COLONOSCOPY WITH PROPOFOL;  Surgeon: [name], MD;  Location: [facility] ENDOSCOPY;  Service: Gastroenterology;  Laterality: N/A;  . microwave procedure     microwave procedure for BPH  . OPEN REDUCTION INTERNAL FIXATION (ORIF) DISTAL RADIAL FRACTURE Right 11/21/2017   Procedure: OPEN REDUCTION INTERNAL FIXATION (ORIF) DISTAL RADIAL FRACTURE;  Surgeon: [name], MD;  Location: [facility] ORS;  Service: Orthopedics;  Laterality: Right;    Home Medications:  Allergies as of 09/25/2020      Reactions   Contrast Media [iodinated  Diagnostic Agents] Hives      Medication List       Accurate as of September 25, 2020 11:57 AM. If you have any questions, ask your nurse or doctor.        atorvastatin 10 MG tablet Commonly known as: LIPITOR Take 10 mg by mouth daily.   dutasteride 0.5 MG capsule Commonly known as: AVODART Take 1 capsule (0.5 mg total) by mouth daily.   lisinopril-hydrochlorothiazide 10-12.5 MG tablet Commonly known as: ZESTORETIC Take 1 tablet by mouth daily.   pantoprazole 20 MG tablet Commonly known as: PROTONIX       Allergies:  Allergies  Allergen Reactions  . Contrast Media [Iodinated Diagnostic Agents] Hives    Family History: Family History  Problem Relation Age of Onset  . Benign prostatic hyperplasia Father   . Benign prostatic hyperplasia Brother   . Kidney cancer Neg Hx   . Kidney disease Neg Hx   . Prostate cancer Neg Hx     Social History:  reports that he quit smoking about 57 years ago. His smoking use included cigarettes. He has a 2.00 pack-year smoking history. He has quit using smokeless tobacco. He reports that he does not drink alcohol and does not use drugs.   Physical Exam: BP (!) 145/91   Pulse 74   Ht 5\' 8"  (1.727 m)   Wt 186 lb (84.4 kg)   BMI 28.28 kg/m   Constitutional:  Alert and oriented, No acute distress. HEENT: [place] AT, moist mucus membranes.  Trachea midline, no masses. Cardiovascular: No clubbing, cyanosis, or edema. Respiratory: Normal respiratory effort, no increased work of breathing. Psychiatric: Normal mood and affect.   Assessment & Plan:  1. Gross hematuria  Recent evaluation for gross materia with significant BPH with hypervascularity  Most recent episode occurred after intercourse supporting prostatic bleeding as his hematuria source  Urine cytology at the time of cystoscopy was negative  Prostate volume on MRI 2019 was 100 cc an was calculated from his recent CT urogram with estimated volume 125 cc  Based on his  recent evaluation would not repeat imaging or cystoscopy at this time  He has been on dutasteride for several years and if his hematuria continues would consider outlet procedure  Based on prostate volume would recommend HoLEP  Follow-up 6 months and instructed to call earlier for worsening gross hematuria   Abbie Sons, MD  [facility] [address] [place], [place] [address] [phone]

## 2020-09-26 ENCOUNTER — Encounter: Payer: Self-pay | Admitting: Urology

## 2020-10-23 ENCOUNTER — Other Ambulatory Visit: Payer: Self-pay | Admitting: Urology

## 2020-11-02 ENCOUNTER — Telehealth: Payer: Self-pay | Admitting: *Deleted

## 2020-11-02 MED ORDER — DUTASTERIDE 0.5 MG PO CAPS: 0.5000 mg | ORAL_CAPSULE | Freq: Every day | ORAL | 1 refills | Status: DC

## 2020-11-02 NOTE — Telephone Encounter (Signed)
If he takes the dutasteride daily it may decrease his hematuria

## 2020-11-02 NOTE — Telephone Encounter (Signed)
Patient states he takes one weekly.

## 2020-11-02 NOTE — Telephone Encounter (Signed)
Notified patient as instructed, Patient will take medication daily. Sent in medication for 90 days

## 2020-12-02 ENCOUNTER — Ambulatory Visit: Payer: Self-pay | Admitting: Urology

## 2021-04-02 ENCOUNTER — Ambulatory Visit (INDEPENDENT_AMBULATORY_CARE_PROVIDER_SITE_OTHER): Payer: Medicare PPO | Admitting: Urology

## 2021-04-02 ENCOUNTER — Other Ambulatory Visit: Payer: Self-pay | Admitting: Urology

## 2021-04-02 ENCOUNTER — Encounter: Payer: Self-pay | Admitting: Urology

## 2021-04-02 ENCOUNTER — Other Ambulatory Visit: Payer: Self-pay

## 2021-04-02 VITALS — BP 167/99 | HR 75 | Ht 65.0 in | Wt 184.0 lb

## 2021-04-02 DIAGNOSIS — N5201 Erectile dysfunction due to arterial insufficiency: Secondary | ICD-10-CM

## 2021-04-02 DIAGNOSIS — R310 Gross hematuria: Secondary | ICD-10-CM | POA: Diagnosis not present

## 2021-04-02 DIAGNOSIS — N401 Enlarged prostate with lower urinary tract symptoms: Secondary | ICD-10-CM | POA: Diagnosis not present

## 2021-04-02 MED ORDER — TADALAFIL 20 MG PO TABS: ORAL_TABLET | ORAL | 0 refills | Status: DC

## 2021-04-02 NOTE — Progress Notes (Signed)
04/02/2021 11:11 AM   Joshua Drake 1946-05-08 [id number]  Referring provider: Dion Body, MD [facility] [place],  [place] [address]  Chief Complaint  Patient presents with   Hematuria    Urologic history: 1.  Elevated PSA             -Biopsy 01/2012; uncorrected PSA 12.2             -Volume 81 cc; benign             -Recent PSAs 4-6 range  -PSA monitoring discontinued 2020  2.  BPH with LUTS             -TUMT Dr. Yves Dill 2009             -Placed on Avodart post procedure 3.  Gross hematuria  -Eval 05/2020  -CTU no upper tract abnormalities  -Cystoscopy significant prostate enlargement with hypervascularity  -Prostate volume calculated 125 cc  HPI: 75 y.o. male presents for 34-monthfollow-up.  Seen January 2022 with recurrent hematuria Was on dutasteride once a week and this was increased to daily Since his last visit he may have had 1 episode of gross hematuria after strenuous hike but is not completely sure he saw blood Mild LUTS; IPSS 5/35 Since his last visit he has developed difficulty achieving and maintaining an erection.  He has partial erections which are generally not firm enough for penetration No pain or curvature with erections No nitrate therapy   PMH: Past Medical History:  Diagnosis Date   Actinic keratosis    Allergic rhinitis    BPH (benign prostatic hyperplasia)    GERD (gastroesophageal reflux disease)    Hyperlipidemia    Hypertension    Pre-diabetes     Surgical History: Past Surgical History:  Procedure Laterality Date   CHOLECYSTECTOMY     COLONOSCOPY WITH PROPOFOL N/A 04/06/2015   Procedure: COLONOSCOPY WITH PROPOFOL;  Surgeon: PHulen Luster MD;  [facility];  Service: Gastroenterology;  Laterality: N/A;   microwave procedure     microwave procedure for BPH   OPEN REDUCTION INTERNAL FIXATION (ORIF) DISTAL RADIAL FRACTURE Right 11/21/2017   Procedure: OPEN REDUCTION INTERNAL FIXATION  (ORIF) DISTAL RADIAL FRACTURE;  Surgeon: MHessie Knows MD;  Location: [facility] ORS;  Service: Orthopedics;  Laterality: Right;    Home Medications:  Allergies as of 04/02/2021       Reactions   Contrast Media [iodinated Diagnostic Agents] Hives        Medication List        Accurate as of April 02, 2021 11:11 AM. If you have any questions, ask your nurse or doctor.          atorvastatin 10 MG tablet Commonly known as: LIPITOR Take 10 mg by mouth daily.   dutasteride 0.5 MG capsule Commonly known as: AVODART Take 1 capsule (0.5 mg total) by mouth daily.   lisinopril-hydrochlorothiazide 10-12.5 MG tablet Commonly known as: ZESTORETIC Take 1 tablet by mouth daily.   pantoprazole 20 MG tablet Commonly known as: PROTONIX        Allergies:  Allergies  Allergen Reactions   Contrast Media [Iodinated Diagnostic Agents] Hives    Family History: Family History  Problem Relation Age of Onset   Benign prostatic hyperplasia Father    Benign prostatic hyperplasia Brother    Kidney cancer Neg Hx    Kidney disease Neg Hx    Prostate cancer Neg Hx     Social History:  reports that he quit smoking about 57 years ago. His smoking use included cigarettes. He has a 2.00 pack-year smoking history. He has quit using smokeless tobacco. He reports that he does not drink alcohol and does not use drugs.   Physical Exam: BP (!) 167/99   Pulse 75   Ht '5\' 5"'$  (1.651 m)   Wt 184 lb (83.5 kg)   BMI 30.62 kg/m   Constitutional:  Alert and oriented, No acute distress. HEENT: [place] AT, moist mucus membranes.  Trachea midline, no masses. Cardiovascular: No clubbing, cyanosis, or edema. Respiratory: Normal respiratory effort, no increased work of breathing.   Assessment & Plan:    1.  BPH with LUTS Stable on dutasteride, mild voiding symptoms Continue annual follow-up  2.  Gross hematuria Resolved, secondary to above  3.  Erectile dysfunction New problem We discussed the vast  majority of ED is related to arterial insufficiency We discussed possibility this could have been related to the increase in dutasteride from once weekly to daily and could decreasing to 3 times weekly He was interested in a PDE 5 inhibitor trial and Rx tadalafil 10-20 mg 1 hour prior to intercourse   Abbie Sons, MD  [facility] [address], [place], [place] [address] [phone]

## 2021-04-28 ENCOUNTER — Ambulatory Visit (INDEPENDENT_AMBULATORY_CARE_PROVIDER_SITE_OTHER): Payer: Medicare PPO | Admitting: Dermatology

## 2021-04-28 ENCOUNTER — Other Ambulatory Visit: Payer: Self-pay

## 2021-04-28 DIAGNOSIS — D171 Benign lipomatous neoplasm of skin and subcutaneous tissue of trunk: Secondary | ICD-10-CM | POA: Diagnosis not present

## 2021-04-28 DIAGNOSIS — L578 Other skin changes due to chronic exposure to nonionizing radiation: Secondary | ICD-10-CM | POA: Diagnosis not present

## 2021-04-28 DIAGNOSIS — Z1283 Encounter for screening for malignant neoplasm of skin: Secondary | ICD-10-CM | POA: Diagnosis not present

## 2021-04-28 DIAGNOSIS — D229 Melanocytic nevi, unspecified: Secondary | ICD-10-CM

## 2021-04-28 DIAGNOSIS — L814 Other melanin hyperpigmentation: Secondary | ICD-10-CM | POA: Diagnosis not present

## 2021-04-28 DIAGNOSIS — L820 Inflamed seborrheic keratosis: Secondary | ICD-10-CM

## 2021-04-28 DIAGNOSIS — L570 Actinic keratosis: Secondary | ICD-10-CM | POA: Diagnosis not present

## 2021-04-28 DIAGNOSIS — L821 Other seborrheic keratosis: Secondary | ICD-10-CM

## 2021-04-28 DIAGNOSIS — L918 Other hypertrophic disorders of the skin: Secondary | ICD-10-CM

## 2021-04-28 DIAGNOSIS — D1800 Hemangioma unspecified site: Secondary | ICD-10-CM

## 2021-04-28 NOTE — Progress Notes (Signed)
Follow-Up Visit   Subjective  Joshua Drake is a 75 y.o. male who presents for the following: Annual Exam (Hx AK's ). The patient presents for Total-Body Skin Exam (TBSE) for skin cancer screening and mole check.  The following portions of the chart were reviewed this encounter and updated as appropriate:   Tobacco  Allergies  Meds  Problems  Med Hx  Surg Hx  Fam Hx     Review of Systems:  No other skin or systemic complaints except as noted in HPI or Assessment and Plan.  Objective  Well appearing patient in no apparent distress; mood and affect are within normal limits.  A full examination was performed including scalp, head, eyes, ears, nose, lips, neck, chest, axillae, abdomen, back, buttocks, bilateral upper extremities, bilateral lower extremities, hands, feet, fingers, toes, fingernails, and toenails. All findings within normal limits unless otherwise noted below.  R inf prox nasal bridge x 1, scalp/face/ears x 7 (8) Erythematous thin papules/macules with gritty scale.   R preauricular x 1, L thigh x 3, R calf x 3, L forehead x 1 (8) Erythematous keratotic or waxy stuck-on papule or plaque.   upper back Rubbery nodules.  Assessment & Plan  Actinic keratosis (8) R inf prox nasal bridge x 1, scalp/face/ears x 7  Destruction of lesion - R inf prox nasal bridge x 1, scalp/face/ears x 7 Complexity: simple   Destruction method: cryotherapy   Informed consent: discussed and consent obtained   Timeout:  patient name, date of birth, surgical site, and procedure verified Lesion destroyed using liquid nitrogen: Yes   Region frozen until ice ball extended beyond lesion: Yes   Outcome: patient tolerated procedure well with no complications   Post-procedure details: wound care instructions given    Inflamed seborrheic keratosis R preauricular x 1, L thigh x 3, R calf x 3, L forehead x 1  Destruction of lesion - R preauricular x 1, L thigh x 3, R calf x 3, L forehead x  1 Complexity: simple   Destruction method: cryotherapy   Informed consent: discussed and consent obtained   Timeout:  patient name, date of birth, surgical site, and procedure verified Lesion destroyed using liquid nitrogen: Yes   Region frozen until ice ball extended beyond lesion: Yes   Outcome: patient tolerated procedure well with no complications   Post-procedure details: wound care instructions given    Lipoma of torso upper back  Benign-appearing.  Observation.  Call clinic for new or changing lesions.  Recommend daily use of broad spectrum spf 30+ sunscreen to sun-exposed areas.    Skin cancer screening  Lentigines - Scattered tan macules - Due to sun exposure - Benign-appering, observe - Recommend daily broad spectrum sunscreen SPF 30+ to sun-exposed areas, reapply every 2 hours as needed. - Call for any changes  Seborrheic Keratoses - Stuck-on, waxy, tan-brown papules and/or plaques  - Benign-appearing - Discussed benign etiology and prognosis. - Observe - Call for any changes  Melanocytic Nevi - Tan-brown and/or pink-flesh-colored symmetric macules and papules - Benign appearing on exam today - Observation - Call clinic for new or changing moles - Recommend daily use of broad spectrum spf 30+ sunscreen to sun-exposed areas.   Hemangiomas - Red papules - Discussed benign nature - Observe - Call for any changes  Actinic Damage - Chronic condition, secondary to cumulative UV/sun exposure - diffuse scaly erythematous macules with underlying dyspigmentation - Recommend daily broad spectrum sunscreen SPF 30+ to sun-exposed areas, reapply every  2 hours as needed.  - Staying in the shade or wearing long sleeves, sun glasses (UVA+UVB protection) and wide brim hats (4-inch brim around the entire circumference of the hat) are also recommended for sun protection.  - Call for new or changing lesions.  Acrochordons (Skin Tags) - Fleshy, skin-colored pedunculated  papules - Benign appearing.  - Observe. - If desired, they can be removed with an in office procedure that is not covered by insurance. - Please call the clinic if you notice any new or changing lesions.  Skin cancer screening performed today.  Return in about 2 months (around 06/28/2021) for AK follow up .  Luther Redo, CMA, am acting as scribe for Sarina Ser, MD . Documentation: I have reviewed the above documentation for accuracy and completeness, and I agree with the above.  Sarina Ser, MD

## 2021-04-28 NOTE — Patient Instructions (Signed)

## 2021-05-02 ENCOUNTER — Encounter: Payer: Self-pay | Admitting: Dermatology

## 2021-07-01 ENCOUNTER — Other Ambulatory Visit: Payer: Self-pay

## 2021-07-01 ENCOUNTER — Ambulatory Visit (INDEPENDENT_AMBULATORY_CARE_PROVIDER_SITE_OTHER): Payer: Medicare PPO | Admitting: Dermatology

## 2021-07-01 DIAGNOSIS — L820 Inflamed seborrheic keratosis: Secondary | ICD-10-CM | POA: Diagnosis not present

## 2021-07-01 DIAGNOSIS — L570 Actinic keratosis: Secondary | ICD-10-CM | POA: Diagnosis not present

## 2021-07-01 DIAGNOSIS — L578 Other skin changes due to chronic exposure to nonionizing radiation: Secondary | ICD-10-CM

## 2021-07-01 NOTE — Patient Instructions (Addendum)
Actinic keratoses are precancerous spots that appear secondary to cumulative UV radiation exposure/sun exposure over time. They are chronic with expected duration over 1 year. A portion of actinic keratoses will progress to squamous cell carcinoma of the skin. It is not possible to reliably predict which spots will progress to skin cancer and so treatment is recommended to prevent development of skin cancer.  Recommend daily broad spectrum sunscreen SPF 30+ to sun-exposed areas, reapply every 2 hours as needed.  Recommend staying in the shade or wearing long sleeves, sun glasses (UVA+UVB protection) and wide brim hats (4-inch brim around the entire circumference of the hat). Call for new or changing lesions.   Cryotherapy Aftercare  Wash gently with soap and water everyday.   Apply Vaseline and Band-Aid daily until healed.   If you have any questions or concerns for your doctor, please call our main line at [phone] and press option 4 to reach your doctor's medical assistant. If no one answers, please leave a voicemail as directed and we will return your call as soon as possible. Messages left after 4 pm will be answered the following business day.   You may also send us a message via MyChart. We typically respond to MyChart messages within 1-2 business days.  For prescription refills, please ask your pharmacy to contact our office. Our fax number is [phone].  If you have an urgent issue when the clinic is closed that cannot wait until the next business day, you can page your doctor at the number below.    Please note that while we do our best to be available for urgent issues outside of office hours, we are not available 24/7.   If you have an urgent issue and are unable to reach us, you may choose to seek medical care at your doctor's office, retail clinic, urgent care center, or emergency room.  If you have a medical emergency, please immediately call 911 or go to the emergency  department.  Pager Numbers  - Dr. Kowalski: [phone]  - Dr. Moye: [phone]  - Dr. Stewart: [phone]  In the event of inclement weather, please call our main line at [phone] for an update on the status of any delays or closures.  Dermatology Medication Tips: Please keep the boxes that topical medications come in in order to help keep track of the instructions about where and how to use these. Pharmacies typically print the medication instructions only on the boxes and not directly on the medication tubes.   If your medication is too expensive, please contact our office at [phone] option 4 or send us a message through MyChart.   We are unable to tell what your co-pay for medications will be in advance as this is different depending on your insurance coverage. However, we may be able to find a substitute medication at lower cost or fill out paperwork to get insurance to cover a needed medication.   If a prior authorization is required to get your medication covered by your insurance company, please allow us 1-2 business days to complete this process.  Drug prices often vary depending on where the prescription is filled and some pharmacies may offer cheaper prices.  The website www.goodrx.com contains coupons for medications through different pharmacies. The prices here do not account for what the cost may be with help from insurance (it may be cheaper with your insurance), but the website can give you the price if you did not use any insurance.  - You   can print the associated coupon and take it with your prescription to the pharmacy.  - You may also stop by our office during regular business hours and pick up a GoodRx coupon card.  - If you need your prescription sent electronically to a different pharmacy, notify our office through [facility] MyChart or by phone at [phone] option 4.  

## 2021-07-01 NOTE — Progress Notes (Signed)
Follow-Up Visit   Subjective  Joshua Drake is a 75 y.o. male who presents for the following: Follow-up (Patient here today for 2 month ak follow up at face, scalp and ears. Patient reports spot at nose has not went away. Denies other concerns at this time. ).  The following portions of the chart were reviewed this encounter and updated as appropriate:  Tobacco  Allergies  Meds  Problems  Med Hx  Surg Hx  Fam Hx     Review of Systems: No other skin or systemic complaints except as noted in HPI or Assessment and Plan.  Objective  Well appearing patient in no apparent distress; mood and affect are within normal limits.  A focused examination was performed including face, scalp, nose. Relevant physical exam findings are noted in the Assessment and Plan.  right nose x 1, scalp / face  x 20 (21) Erythematous thin papules/macules with gritty scale.   scalp x 3 (3) Erythematous keratotic or waxy stuck-on papule or plaque.   Assessment & Plan  Actinic keratosis (21) right nose x 1, scalp / face  x 20  Actinic keratoses are precancerous spots that appear secondary to cumulative UV radiation exposure/sun exposure over time. They are chronic with expected duration over 1 year. A portion of actinic keratoses will progress to squamous cell carcinoma of the skin. It is not possible to reliably predict which spots will progress to skin cancer and so treatment is recommended to prevent development of skin cancer.  Recommend daily broad spectrum sunscreen SPF 30+ to sun-exposed areas, reapply every 2 hours as needed.  Recommend staying in the shade or wearing long sleeves, sun glasses (UVA+UVB protection) and wide brim hats (4-inch brim around the entire circumference of the hat). Call for new or changing lesions.  Recheck area at right nose at next follow up  Destruction of lesion - right nose x 1, scalp / face  x 20 Complexity: simple   Destruction method: cryotherapy   Informed  consent: discussed and consent obtained   Timeout:  patient name, date of birth, surgical site, and procedure verified Lesion destroyed using liquid nitrogen: Yes   Region frozen until ice ball extended beyond lesion: Yes   Outcome: patient tolerated procedure well with no complications   Post-procedure details: wound care instructions given    Inflamed seborrheic keratosis scalp x 3  Destruction of lesion - scalp x 3 Complexity: simple   Destruction method: cryotherapy   Informed consent: discussed and consent obtained   Timeout:  patient name, date of birth, surgical site, and procedure verified Lesion destroyed using liquid nitrogen: Yes   Region frozen until ice ball extended beyond lesion: Yes   Outcome: patient tolerated procedure well with no complications   Post-procedure details: wound care instructions given    Actinic Damage - Severe, confluent actinic changes with pre-cancerous actinic keratoses  - Severe, chronic, not at goal, secondary to cumulative UV radiation exposure over time - diffuse scaly erythematous macules and papules with underlying dyspigmentation - Discussed Prescription "Field Treatment" for Severe, Chronic Confluent Actinic Changes with Pre-Cancerous Actinic Keratoses Field treatment involves treatment of an entire area of skin that has confluent Actinic Changes (Sun/ Ultraviolet light damage) and PreCancerous Actinic Keratoses by method of PhotoDynamic Therapy (PDT) and/or prescription Topical Chemotherapy agents such as 5-fluorouracil, 5-fluorouracil/calcipotriene, and/or imiquimod.  The purpose is to decrease the number of clinically evident and subclinical PreCancerous lesions to prevent progression to development of skin cancer by chemically destroying  early precancer changes that may or may not be visible.  It has been shown to reduce the risk of developing skin cancer in the treated area. As a result of treatment, redness, scaling, crusting, and open sores  may occur during treatment course. One or more than one of these methods may be used and may have to be used several times to control, suppress and eliminate the PreCancerous changes. Discussed treatment course, expected reaction, and possible side effects. - Recommend daily broad spectrum sunscreen SPF 30+ to sun-exposed areas, reapply every 2 hours as needed.  - Staying in the shade or wearing long sleeves, sun glasses (UVA+UVB protection) and wide brim hats (4-inch brim around the entire circumference of the hat) are also recommended. - Call for new or changing lesions. - Will schedule photodynamic therapy to the scalp in 1 month   Return for pdt at scalp in 1 month , 4 month ak follow up . IRuthell Rummage, CMA, am acting as scribe for Sarina Ser, MD. Documentation: I have reviewed the above documentation for accuracy and completeness, and I agree with the above.  Sarina Ser, MD

## 2021-07-06 ENCOUNTER — Encounter: Payer: Self-pay | Admitting: Dermatology

## 2021-07-29 ENCOUNTER — Other Ambulatory Visit: Payer: Self-pay

## 2021-07-29 ENCOUNTER — Ambulatory Visit (INDEPENDENT_AMBULATORY_CARE_PROVIDER_SITE_OTHER): Payer: Medicare PPO

## 2021-07-29 DIAGNOSIS — L570 Actinic keratosis: Secondary | ICD-10-CM

## 2021-07-29 MED ORDER — AMINOLEVULINIC ACID HCL 20 % EX SOLR
1.0000 "application " | Freq: Once | CUTANEOUS | Status: AC
  Administered 2021-07-29: 09:00:00 354 mg via TOPICAL

## 2021-07-29 NOTE — Progress Notes (Signed)
Patient completed PDT therapy today.  1. AK (actinic keratosis) Scalp  Photodynamic therapy - Scalp Procedure discussed: discussed risks, benefits, side effects. and alternatives   Prep: site scrubbed/prepped with acetone   Location:  Scalp Number of lesions:  Multiple Type of treatment:  Blue light Aminolevulinic Acid (see MAR for details): Levulan Number of Levulan sticks used:  1 Incubation time (minutes):  120 Number of minutes under lamp:  16 Number of seconds under lamp:  40 Cooling:  Floor fan Outcome: patient tolerated procedure well with no complications   Post-procedure details: sunscreen applied    Patient provided samples of SolBar Sunscreen, VaniCream Moisturizer and Body wash.

## 2021-07-29 NOTE — Patient Instructions (Signed)

## 2021-11-02 ENCOUNTER — Ambulatory Visit: Payer: Medicare PPO | Admitting: Dermatology

## 2021-12-02 ENCOUNTER — Other Ambulatory Visit: Payer: Self-pay

## 2021-12-02 ENCOUNTER — Ambulatory Visit (INDEPENDENT_AMBULATORY_CARE_PROVIDER_SITE_OTHER): Payer: Medicare PPO | Admitting: Dermatology

## 2021-12-02 DIAGNOSIS — L578 Other skin changes due to chronic exposure to nonionizing radiation: Secondary | ICD-10-CM | POA: Diagnosis not present

## 2021-12-02 DIAGNOSIS — L918 Other hypertrophic disorders of the skin: Secondary | ICD-10-CM | POA: Diagnosis not present

## 2021-12-02 DIAGNOSIS — L821 Other seborrheic keratosis: Secondary | ICD-10-CM

## 2021-12-02 DIAGNOSIS — L820 Inflamed seborrheic keratosis: Secondary | ICD-10-CM | POA: Diagnosis not present

## 2021-12-02 DIAGNOSIS — L570 Actinic keratosis: Secondary | ICD-10-CM

## 2021-12-02 NOTE — Patient Instructions (Addendum)

## 2021-12-02 NOTE — Progress Notes (Signed)
? ?  Follow-Up Visit ?  ?Subjective  ?Joshua Drake is a 76 y.o. male who presents for the following: Actinic Keratosis (Face, scalp, 71mf/u, PDT with ALA scalp 07/2021). ?The patient has spots, moles and lesions to be evaluated, some may be new or changing and the patient has concerns that these could be cancer. ? ?The following portions of the chart were reviewed this encounter and updated as appropriate:  ? Tobacco  Allergies  Meds  Problems  Med Hx  Surg Hx  Fam Hx   ?  ?Review of Systems:  No other skin or systemic complaints except as noted in HPI or Assessment and Plan. ? ?Objective  ?Well appearing patient in no apparent distress; mood and affect are within normal limits. ? ?A focused examination was performed including face, scalp, ears, arms, back. Relevant physical exam findings are noted in the Assessment and Plan. ? ?face, scalp x 10 (10) ?Pink scaly macules ? ?Right Upper Back x 1 ?Stuck on waxy paps with erythema ? ? ?Assessment & Plan  ? ?Seborrheic Keratoses ?- Stuck-on, waxy, tan-brown papules and/or plaques  ?- Benign-appearing ?- Discussed benign etiology and prognosis. ?- Observe ?- Call for any changes ? ?Actinic Damage ?- chronic, secondary to cumulative UV radiation exposure/sun exposure over time ?- diffuse scaly erythematous macules with underlying dyspigmentation ?- Recommend daily broad spectrum sunscreen SPF 30+ to sun-exposed areas, reapply every 2 hours as needed.  ?- Recommend staying in the shade or wearing long sleeves, sun glasses (UVA+UVB protection) and wide brim hats (4-inch brim around the entire circumference of the hat). ?- Call for new or changing lesions.  ? ?Acrochordons (Skin Tags) ?- Fleshy, skin-colored pedunculated papules ?- Benign appearing.  ?- Observe. ?- If desired, they can be removed with an in office procedure that is not covered by insurance. ?- Please call the clinic if you notice any new or changing lesions.  ? ?AK (actinic keratosis) (10) ?face,  scalp x 10 ? ?Destruction of lesion - face, scalp x 10 ?Complexity: simple   ?Destruction method: cryotherapy   ?Informed consent: discussed and consent obtained   ?Timeout:  patient name, date of birth, surgical site, and procedure verified ?Lesion destroyed using liquid nitrogen: Yes   ?Region frozen until ice ball extended beyond lesion: Yes   ?Outcome: patient tolerated procedure well with no complications   ?Post-procedure details: wound care instructions given   ? ?Inflamed seborrheic keratosis ?Right Upper Back x 1 ? ?Destruction of lesion - Right Upper Back x 1 ?Complexity: simple   ?Destruction method: cryotherapy   ?Informed consent: discussed and consent obtained   ?Timeout:  patient name, date of birth, surgical site, and procedure verified ?Lesion destroyed using liquid nitrogen: Yes   ?Region frozen until ice ball extended beyond lesion: Yes   ?Outcome: patient tolerated procedure well with no complications   ?Post-procedure details: wound care instructions given   ? ? ?Return for as schedueld for TBSE 04/2022, Hx of AKs. ? ?I, SOthelia Pulling RMA, am acting as scribe for DSarina Ser MD . ?Documentation: I have reviewed the above documentation for accuracy and completeness, and I agree with the above. ? ?DSarina Ser MD ? ?

## 2021-12-06 ENCOUNTER — Encounter: Payer: Self-pay | Admitting: Dermatology

## 2021-12-15 IMAGING — CT CT ABD-PEL WO/W CM
3 of 12 series · 12 of 46 positions shown, 18 images · IV contrast (omnipaque)
Comparison: None.

CLINICAL DATA: Gross hematuria. Asymptomatic. Urinary tract
infection 1 year ago.

EXAM:
CT ABDOMEN AND PELVIS WITHOUT AND WITH CONTRAST
TECHNIQUE: Multidetector CT imaging of the abdomen and pelvis was performed
following the standard protocol before and following the bolus
administration of intravenous contrast.
CONTRAST:  125mL OMNIPAQUE IOHEXOL 300 MG/ML  SOLN

[Series 2: abd without pre 5.00 · axial · non-contrast · 0.94mm/px · z∈[-1509,-1144]mm · 7 of 99 slices shown, 12 images]
[im 13/99  soft-tissue]
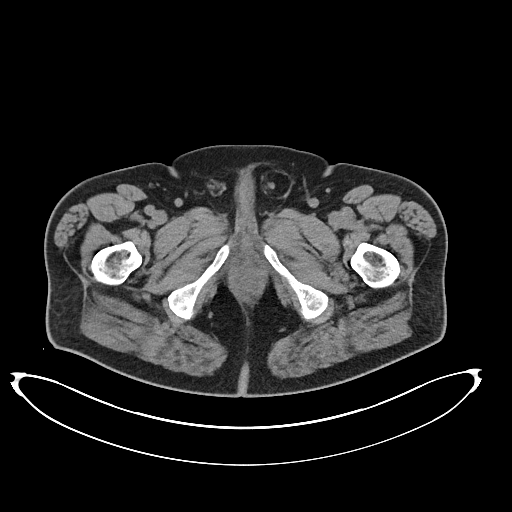
[im 13/99  bone]
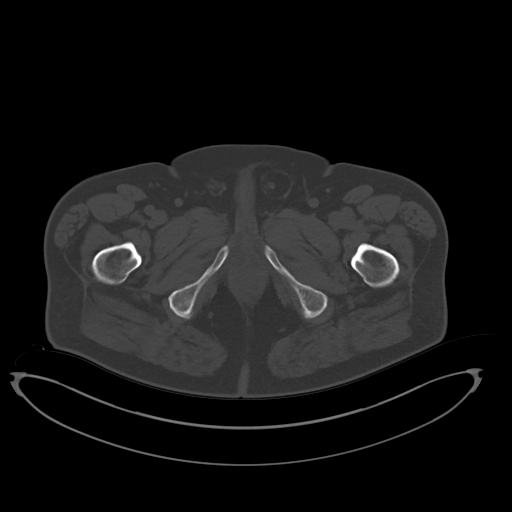
[im 25/99  soft-tissue]
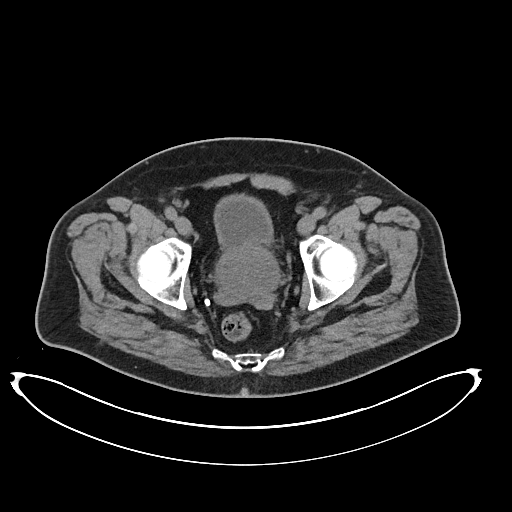
[im 37/99  soft-tissue]
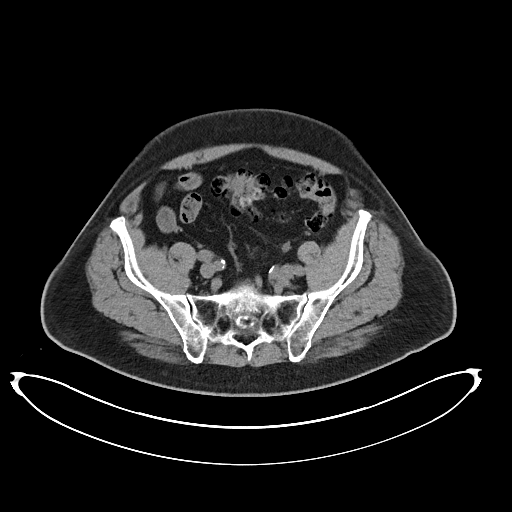
[im 50/99  soft-tissue]
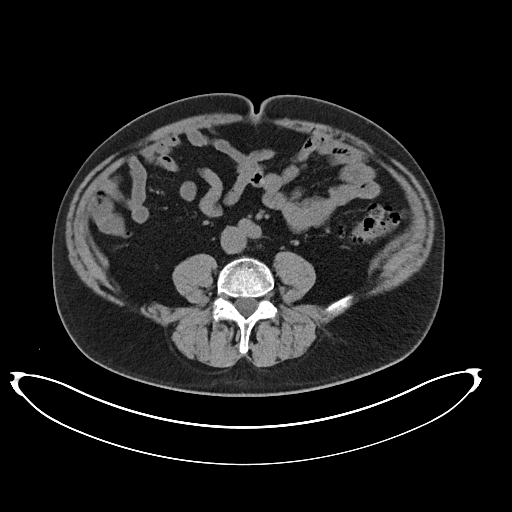
[im 50/99  lung]
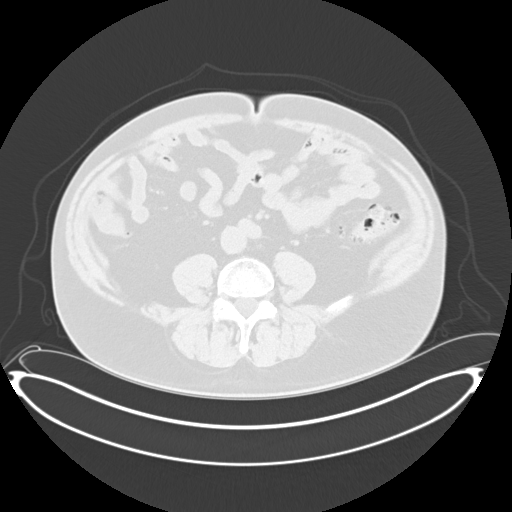
[im 62/99  soft-tissue]
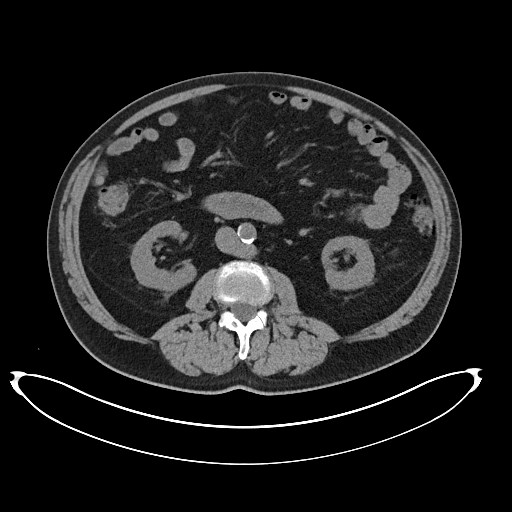
[im 62/99  lung]
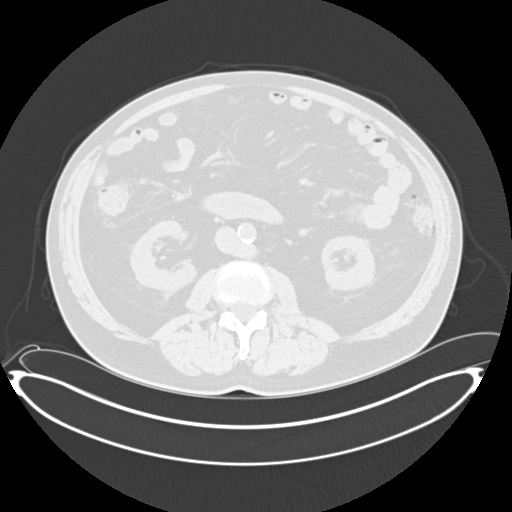
[im 74/99  soft-tissue]
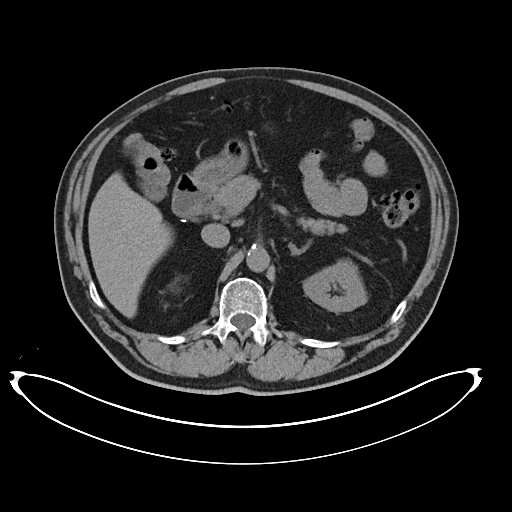
[im 74/99  lung]
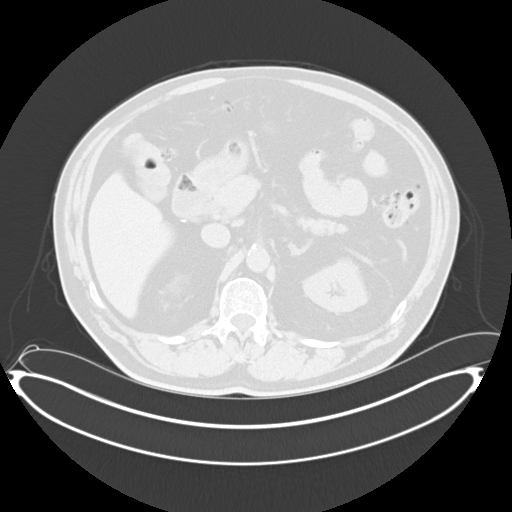
[im 86/99  soft-tissue]
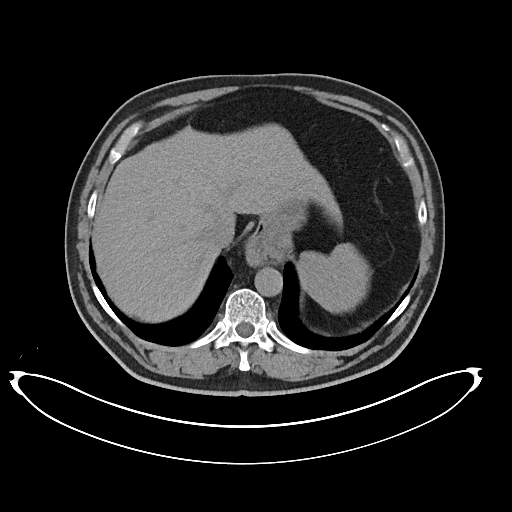
[im 86/99  lung]
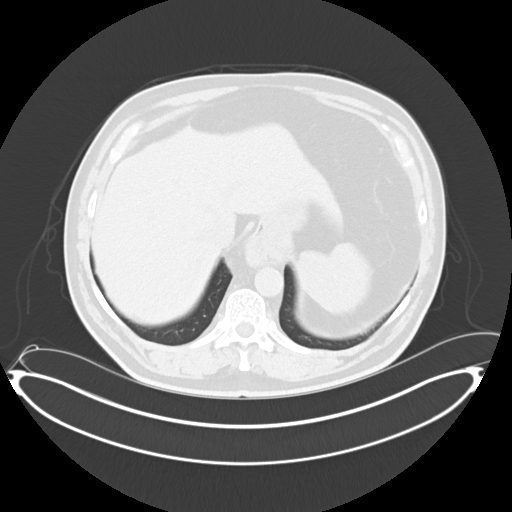

[Series 5: cor without without pre 2.00 cor · coronal · non-contrast · 0.79mm/px · 2 of 157 slices shown, 3 images]
[im 53/157  soft-tissue]
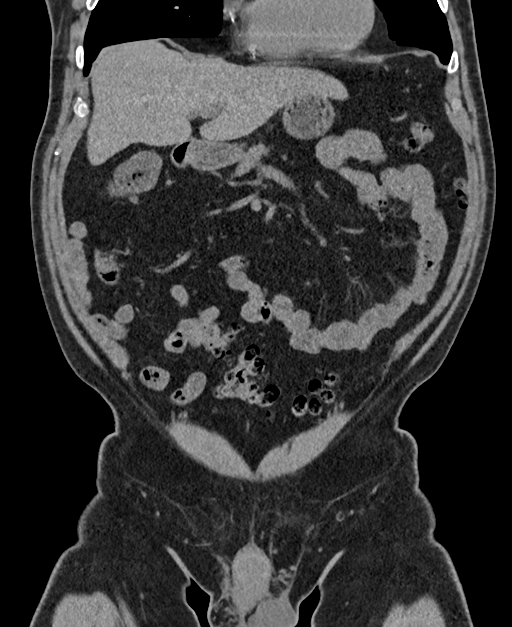
[im 53/157  bone]
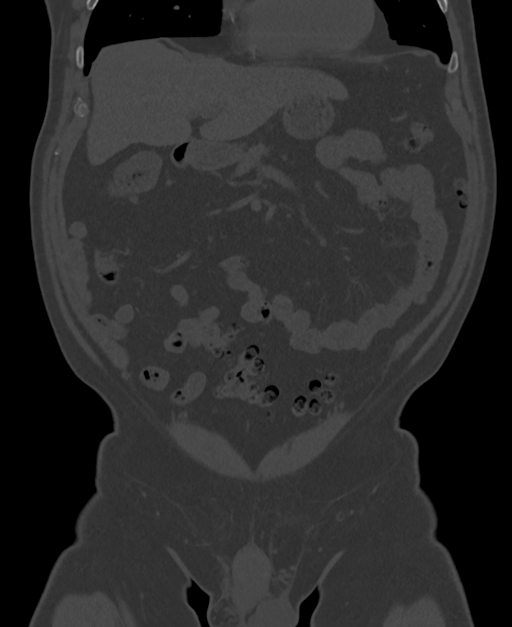
[im 105/157  soft-tissue]
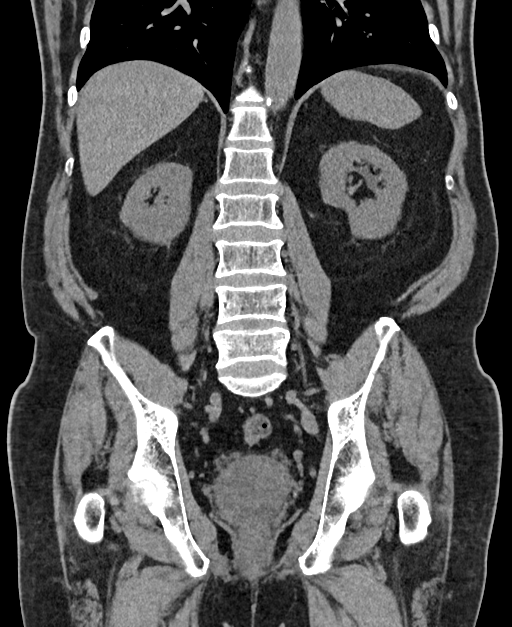

[Series 9: axial with hematuria with 5.00 · axial · 0.94mm/px · z∈[-1499,-1359]mm · 3 of 99 slices shown]
[im 15/99  soft-tissue]
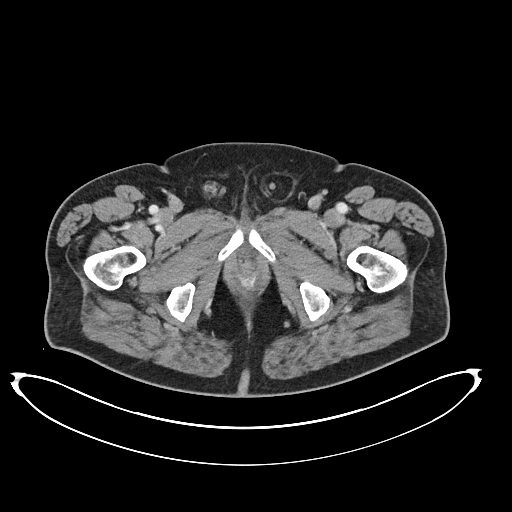
[im 29/99  soft-tissue]
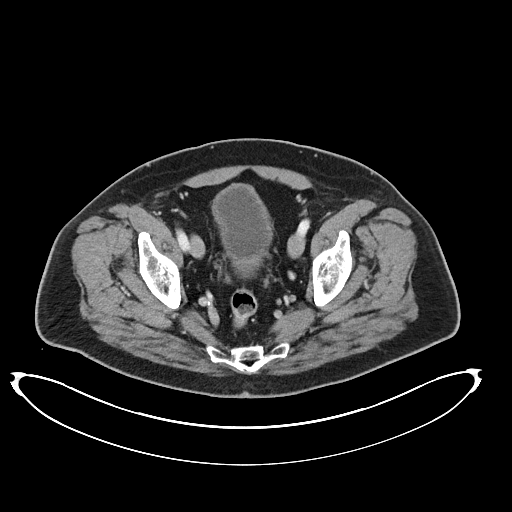
[im 43/99  soft-tissue]
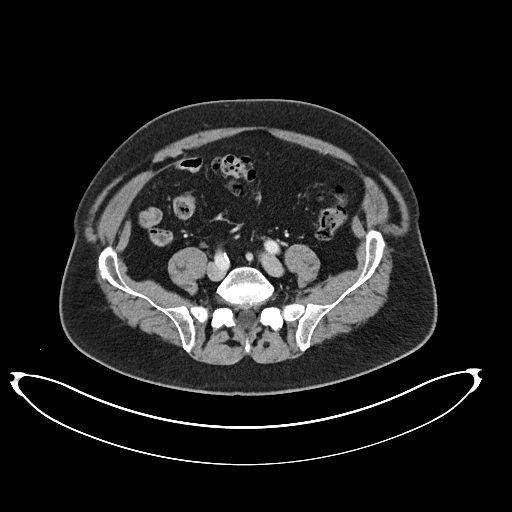

[12 of 46 positions shown; findings below may reference images not displayed]

FINDINGS: Lower chest: 2 mm right middle lobe pulmonary nodule on [DATE].

Normal heart size without pericardial or pleural effusion. Right
coronary artery atherosclerosis. Small hiatal hernia.

Hepatobiliary: Normal liver. Cholecystectomy, without biliary ductal
dilatation.

Pancreas: Normal, without mass or ductal dilatation.

Spleen: Normal in size, without focal abnormality.

Adrenals/Urinary Tract: Normal adrenal glands. 3 mm interpolar left
renal collecting system calculus. Right renal vascular
calcification. No hydronephrosis. No hydroureter or ureteric
calculi. No bladder calculi.

No renal mass on post-contrast imaging. Moderate renal collecting
system opacification on delayed images. Good ureteric opacification,
without bladder filling defect.

No enhancing bladder mass. No posterior dependent bladder filling
defect on delayed images. The anterior bladder is not opacified with
contrast.

Stomach/Bowel: Normal remainder of the stomach. Extensive colonic
diverticulosis. Normal terminal ileum and appendix. Normal small
bowel.

Vascular/Lymphatic: Aortic atherosclerosis. Retroaortic left renal
vein. No abdominopelvic adenopathy.

Reproductive: Moderate prostatomegaly with mild median lobe
impression into the urinary bladder.

Other: No significant free fluid. Tiny left inguinal hernia contains
fat.

Musculoskeletal: No acute osseous abnormality.
IMPRESSION: 1. Left nephrolithiasis. No other explanation for hematuria.
2. Coronary artery atherosclerosis.
3. Small hiatal hernia.
4. Prostatomegaly.
5. 2 mm right middle lobe pulmonary nodule. No follow-up needed if
patient is low-risk. Non-contrast chest CT can be considered in 12
months if patient is high-risk. This recommendation follows the
consensus statement: Guidelines for Management of Incidental
Pulmonary Nodules Detected on CT Images: From the [HOSPITAL]
6. Aortic Atherosclerosis ([id number].V).

## 2022-01-05 ENCOUNTER — Encounter: Payer: Self-pay | Admitting: Dermatology

## 2022-01-05 ENCOUNTER — Encounter: Payer: Self-pay | Admitting: Urology

## 2022-03-22 ENCOUNTER — Encounter: Payer: Self-pay | Admitting: Urology

## 2022-04-04 ENCOUNTER — Ambulatory Visit (INDEPENDENT_AMBULATORY_CARE_PROVIDER_SITE_OTHER): Payer: Medicare PPO | Admitting: Urology

## 2022-04-04 ENCOUNTER — Encounter: Payer: Self-pay | Admitting: Urology

## 2022-04-04 VITALS — BP 147/88 | HR 82 | Ht 65.0 in | Wt 183.0 lb

## 2022-04-04 DIAGNOSIS — N401 Enlarged prostate with lower urinary tract symptoms: Secondary | ICD-10-CM

## 2022-04-04 DIAGNOSIS — R972 Elevated prostate specific antigen [PSA]: Secondary | ICD-10-CM | POA: Diagnosis not present

## 2022-04-04 DIAGNOSIS — N5201 Erectile dysfunction due to arterial insufficiency: Secondary | ICD-10-CM

## 2022-04-04 LAB — URINALYSIS, COMPLETE
Bilirubin, UA: NEGATIVE
Glucose, UA: NEGATIVE
Ketones, UA: NEGATIVE
Leukocytes,UA: NEGATIVE
Nitrite, UA: NEGATIVE
Protein,UA: NEGATIVE
RBC, UA: NEGATIVE
Specific Gravity, UA: 1.015 (ref 1.005–1.030)
Urobilinogen, Ur: 1 mg/dL (ref 0.2–1.0)
pH, UA: 6.5 (ref 5.0–7.5)

## 2022-04-04 LAB — BLADDER SCAN AMB NON-IMAGING: Scan Result: 33

## 2022-04-04 LAB — MICROSCOPIC EXAMINATION: Bacteria, UA: NONE SEEN

## 2022-04-04 MED ORDER — DUTASTERIDE 0.5 MG PO CAPS: 0.5000 mg | ORAL_CAPSULE | Freq: Every day | ORAL | 3 refills | Status: DC

## 2022-04-04 NOTE — Progress Notes (Signed)
04/04/2022 1:41 PM   Joshua Drake [birth year] [id number]  Referring provider: Dion Body, MD [facility] [place] [address],  [name] [address]  Chief Complaint  Patient presents with   Benign Prostatic Hypertrophy    Urologic history: 1.  Elevated PSA             -Biopsy 01/2012; uncorrected PSA 12.2             -Volume 81 cc; benign             -Recent PSAs 4-6 range  -PSA monitoring discontinued 2020  2.  BPH with LUTS             -T[facility] Dr. Yves Dill 2009             -Placed on Avodart post procedure 3.  Gross hematuria  -Eval 05/2020  -CTU no upper tract abnormalities  -Cystoscopy significant prostate enlargement with hypervascularity  -Prostate volume calculated 125 cc  HPI: 76 y.o. male presents for annual follow-up.  Doing well since last visit No bothersome LUTS; IPSS 8/35 Denies dysuria, gross hematuria Denies flank, abdominal or pelvic pain Decreased Avodart to twice weekly Given a trial of tadalafil after last year's visit which he states was partially effective   PMH: Past Medical History:  Diagnosis Date   Actinic keratosis    Allergic rhinitis    BPH (benign prostatic hyperplasia)    GERD (gastroesophageal reflux disease)    Hyperlipidemia    Hypertension    Pre-diabetes     Surgical History: Past Surgical History:  Procedure Laterality Date   CHOLECYSTECTOMY     COLONOSCOPY WITH PROPOFOL N/A 04/06/2015   Procedure: COLONOSCOPY WITH PROPOFOL;  Surgeon: [name], MD;  Location: [facility] ENDOSCOPY;  Service: Gastroenterology;  Laterality: N/A;   microwave procedure     microwave procedure for BPH   OPEN REDUCTION INTERNAL FIXATION (ORIF) DISTAL RADIAL FRACTURE Right 11/21/2017   Procedure: OPEN REDUCTION INTERNAL FIXATION (ORIF) DISTAL RADIAL FRACTURE;  Surgeon: [name], MD;  Location: [facility] ORS;  Service: Orthopedics;  Laterality: Right;    Home Medications:  Allergies as of 04/04/2022       Reactions   Contrast  Media [iodinated Contrast Media] Hives        Medication List        Accurate as of April 04, 2022  1:41 PM. If you have any questions, ask your nurse or doctor.          atorvastatin 10 MG tablet Commonly known as: LIPITOR Take 10 mg by mouth daily.   dutasteride 0.5 MG capsule Commonly known as: AVODART Take 1 capsule (0.5 mg total) by mouth daily.   lisinopril-hydrochlorothiazide 10-12.5 MG tablet Commonly known as: ZESTORETIC Take 1 tablet by mouth daily.   pantoprazole 20 MG tablet Commonly known as: PROTONIX   tadalafil 20 MG tablet Commonly known as: CIALIS 0.5- 1 tab 1 hour prior to intercourse        Allergies:  Allergies  Allergen Reactions   Contrast Media [Iodinated Contrast Media] Hives    Family History: Family History  Problem Relation Age of Onset   Benign prostatic hyperplasia Father    Benign prostatic hyperplasia Brother    Kidney cancer Neg Hx    Kidney disease Neg Hx    Prostate cancer Neg Hx     Social History:  reports that he quit smoking about 58 years ago. His smoking use included cigarettes. He has a 2.00 pack-year  smoking history. He has quit using smokeless tobacco. He reports that he does not drink alcohol and does not use drugs.   Physical Exam: BP (!) 147/88   Pulse 82   Ht '5\' 5"'$  (1.651 m)   Wt 183 lb (83 kg)   BMI 30.45 kg/m   Constitutional:  Alert and oriented, No acute distress. HEENT: [place] AT, moist mucus membranes.  Trachea midline, no masses. Cardiovascular: No clubbing, cyanosis, or edema. Respiratory: Normal respiratory effort, no increased work of breathing.   Assessment & Plan:    1.  BPH with LUTS Stable on dutasteride, mild voiding symptoms Continue annual follow-up Dutasteride refill  2.  Elevated PSA Last PSA was in 2020 Elected to have PSA drawn today  3.  Erectile dysfunction Stable   Abbie Sons, MD  [facility] [address], [place],  [place] [address] [phone]

## 2022-04-05 ENCOUNTER — Encounter: Payer: Self-pay | Admitting: *Deleted

## 2022-04-05 LAB — PSA: Prostate Specific Ag, Serum: 5.5 ng/mL — ABNORMAL HIGH (ref 0.0–4.0)

## 2022-04-14 ENCOUNTER — Encounter: Payer: Self-pay | Admitting: Urology

## 2022-04-28 ENCOUNTER — Ambulatory Visit: Payer: Medicare PPO | Admitting: Dermatology

## 2022-05-04 ENCOUNTER — Ambulatory Visit: Payer: Medicare PPO | Admitting: Dermatology

## 2022-07-07 ENCOUNTER — Ambulatory Visit: Payer: Medicare PPO | Admitting: Dermatology

## 2022-07-19 ENCOUNTER — Ambulatory Visit (INDEPENDENT_AMBULATORY_CARE_PROVIDER_SITE_OTHER): Payer: Medicare PPO | Admitting: Dermatology

## 2022-07-19 DIAGNOSIS — L821 Other seborrheic keratosis: Secondary | ICD-10-CM

## 2022-07-19 DIAGNOSIS — L570 Actinic keratosis: Secondary | ICD-10-CM | POA: Diagnosis not present

## 2022-07-19 DIAGNOSIS — L578 Other skin changes due to chronic exposure to nonionizing radiation: Secondary | ICD-10-CM

## 2022-07-19 DIAGNOSIS — L820 Inflamed seborrheic keratosis: Secondary | ICD-10-CM | POA: Diagnosis not present

## 2022-07-19 DIAGNOSIS — Z1283 Encounter for screening for malignant neoplasm of skin: Secondary | ICD-10-CM | POA: Diagnosis not present

## 2022-07-19 DIAGNOSIS — D229 Melanocytic nevi, unspecified: Secondary | ICD-10-CM

## 2022-07-19 DIAGNOSIS — L918 Other hypertrophic disorders of the skin: Secondary | ICD-10-CM

## 2022-07-19 DIAGNOSIS — L814 Other melanin hyperpigmentation: Secondary | ICD-10-CM | POA: Diagnosis not present

## 2022-07-19 NOTE — Patient Instructions (Signed)
Due to recent changes in healthcare laws, you may see results of your pathology and/or laboratory studies on MyChart before the doctors have had a chance to review them. We understand that in some cases there may be results that are confusing or concerning to you. Please understand that not all results are received at the same time and often the doctors may need to interpret multiple results in order to provide you with the best plan of care or course of treatment. Therefore, we ask that you please give us 2 business days to thoroughly review all your results before contacting the office for clarification. Should we see a critical lab result, you will be contacted sooner.   If You Need Anything After Your Visit  If you have any questions or concerns for your doctor, please call our main line at [phone] and press option 4 to reach your doctor's medical assistant. If no one answers, please leave a voicemail as directed and we will return your call as soon as possible. Messages left after 4 pm will be answered the following business day.   You may also send us a message via MyChart. We typically respond to MyChart messages within 1-2 business days.  For prescription refills, please ask your pharmacy to contact our office. Our fax number is [phone].  If you have an urgent issue when the clinic is closed that cannot wait until the next business day, you can page your doctor at the number below.    Please note that while we do our best to be available for urgent issues outside of office hours, we are not available 24/7.   If you have an urgent issue and are unable to reach us, you may choose to seek medical care at your doctor's office, retail clinic, urgent care center, or emergency room.  If you have a medical emergency, please immediately call 911 or go to the emergency department.  Pager Numbers  - Dr. Kowalski: [phone]  - Dr. Moye: [phone]  - Dr. Stewart:  [phone]  In the event of inclement weather, please call our main line at [phone] for an update on the status of any delays or closures.  Dermatology Medication Tips: Please keep the boxes that topical medications come in in order to help keep track of the instructions about where and how to use these. Pharmacies typically print the medication instructions only on the boxes and not directly on the medication tubes.   If your medication is too expensive, please contact our office at [phone] option 4 or send us a message through MyChart.   We are unable to tell what your co-pay for medications will be in advance as this is different depending on your insurance coverage. However, we may be able to find a substitute medication at lower cost or fill out paperwork to get insurance to cover a needed medication.   If a prior authorization is required to get your medication covered by your insurance company, please allow us 1-2 business days to complete this process.  Drug prices often vary depending on where the prescription is filled and some pharmacies may offer cheaper prices.  The website www.goodrx.com contains coupons for medications through different pharmacies. The prices here do not account for what the cost may be with help from insurance (it may be cheaper with your insurance), but the website can give you the price if you did not use any insurance.  - You can print the associated coupon and take it with   your prescription to the pharmacy.  - You may also stop by our office during regular business hours and pick up a GoodRx coupon card.  - If you need your prescription sent electronically to a different pharmacy, notify our office through [facility] MyChart or by phone at [phone] option 4.     Si Usted Necesita Algo Despus de Su Visita  Tambin puede enviarnos un mensaje a travs de MyChart. Por lo general respondemos a los mensajes de MyChart en el transcurso de 1 a 2  das hbiles.  Para renovar recetas, por favor pida a su farmacia que se ponga en contacto con nuestra oficina. Nuestro nmero de fax es el [phone].  Si tiene un asunto urgente cuando la clnica est cerrada y que no puede esperar hasta el siguiente da hbil, puede llamar/localizar a su doctor(a) al nmero que aparece a continuacin.   Por favor, tenga en cuenta que aunque hacemos todo lo posible para estar disponibles para asuntos urgentes fuera del horario de oficina, no estamos disponibles las 24 horas del da, los 7 das de la semana.   Si tiene un problema urgente y no puede comunicarse con nosotros, puede optar por buscar atencin mdica  en el consultorio de su doctor(a), en una clnica privada, en un centro de atencin urgente o en una sala de emergencias.  Si tiene una emergencia mdica, por favor llame inmediatamente al 911 o vaya a la sala de emergencias.  Nmeros de bper  - Dr. Kowalski: [phone]  - Dra. Moye: [phone]  - Dra. Stewart: [phone]  En caso de inclemencias del tiempo, por favor llame a nuestra lnea principal al [phone] para una actualizacin sobre el estado de cualquier retraso o cierre.  Consejos para la medicacin en dermatologa: Por favor, guarde las cajas en las que vienen los medicamentos de uso tpico para ayudarle a seguir las instrucciones sobre dnde y cmo usarlos. Las farmacias generalmente imprimen las instrucciones del medicamento slo en las cajas y no directamente en los tubos del medicamento.   Si su medicamento es muy caro, por favor, pngase en contacto con nuestra oficina llamando al [phone] y presione la opcin 4 o envenos un mensaje a travs de MyChart.   No podemos decirle cul ser su copago por los medicamentos por adelantado ya que esto es diferente dependiendo de la cobertura de su seguro. Sin embargo, es posible que podamos encontrar un medicamento sustituto a menor costo o llenar un formulario para que el  seguro cubra el medicamento que se considera necesario.   Si se requiere una autorizacin previa para que su compaa de seguros cubra su medicamento, por favor permtanos de 1 a 2 das hbiles para completar este proceso.  Los precios de los medicamentos varan con frecuencia dependiendo del lugar de dnde se surte la receta y alguna farmacias pueden ofrecer precios ms baratos.  El sitio web www.goodrx.com tiene cupones para medicamentos de diferentes farmacias. Los precios aqu no tienen en cuenta lo que podra costar con la ayuda del seguro (puede ser ms barato con su seguro), pero el sitio web puede darle el precio si no utiliz ningn seguro.  - Puede imprimir el cupn correspondiente y llevarlo con su receta a la farmacia.  - Tambin puede pasar por nuestra oficina durante el horario de atencin regular y recoger una tarjeta de cupones de GoodRx.  - Si necesita que su receta se enve electrnicamente a una farmacia diferente, informe a nuestra oficina a travs de MyChart de [place]   o por telfono llamando al [phone] y presione la opcin 4.  

## 2022-07-19 NOTE — Progress Notes (Signed)
Follow-Up Visit   Subjective  Joshua Drake is a 76 y.o. male who presents for the following: Annual Exam (Hx AK's ). The patient presents for Total-Body Skin Exam (TBSE) for skin cancer screening and mole check.  The patient has spots, moles and lesions to be evaluated, some may be new or changing and the patient has concerns that these could be cancer.  The following portions of the chart were reviewed this encounter and updated as appropriate:   Tobacco  Allergies  Meds  Problems  Med Hx  Surg Hx  Fam Hx     Review of Systems:  No other skin or systemic complaints except as noted in HPI or Assessment and Plan.  Objective  Well appearing patient in no apparent distress; mood and affect are within normal limits.  A full examination was performed including scalp, head, eyes, ears, nose, lips, neck, chest, axillae, abdomen, back, buttocks, bilateral upper extremities, bilateral lower extremities, hands, feet, fingers, toes, fingernails, and toenails. All findings within normal limits unless otherwise noted below.  Scalp x 2, R brow x 1, forehead x 1, L ear x 1, R cheek x 2 (7) Erythematous thin papules/macules with gritty scale.   R cheek x 4 (4) Erythematous stuck-on, waxy papule or plaque    Assessment & Plan  AK (actinic keratosis) (7) Scalp x 2, R brow x 1, forehead x 1, L ear x 1, R cheek x 2  Destruction of lesion - Scalp x 2, R brow x 1, forehead x 1, L ear x 1, R cheek x 2 Complexity: simple   Destruction method: cryotherapy   Informed consent: discussed and consent obtained   Timeout:  patient name, date of birth, surgical site, and procedure verified Lesion destroyed using liquid nitrogen: Yes   Region frozen until ice ball extended beyond lesion: Yes   Outcome: patient tolerated procedure well with no complications   Post-procedure details: wound care instructions given    Inflamed seborrheic keratosis (4) R cheek x 4 Symptomatic, irritating, patient would  like treated. Destruction of lesion - R cheek x 4 Complexity: simple   Destruction method: cryotherapy   Informed consent: discussed and consent obtained   Timeout:  patient name, date of birth, surgical site, and procedure verified Lesion destroyed using liquid nitrogen: Yes   Region frozen until ice ball extended beyond lesion: Yes   Outcome: patient tolerated procedure well with no complications   Post-procedure details: wound care instructions given    Lentigines - Scattered tan macules - Due to sun exposure - Benign-appearing, observe - Recommend daily broad spectrum sunscreen SPF 30+ to sun-exposed areas, reapply every 2 hours as needed. - Call for any changes  Seborrheic Keratoses - Stuck-on, waxy, tan-brown papules and/or plaques  - Benign-appearing - Discussed benign etiology and prognosis. - Observe - Call for any changes  Melanocytic Nevi - Tan-brown and/or pink-flesh-colored symmetric macules and papules - Benign appearing on exam today - Observation - Call clinic for new or changing moles - Recommend daily use of broad spectrum spf 30+ sunscreen to sun-exposed areas.   Hemangiomas - Red papules - Discussed benign nature - Observe - Call for any changes  Actinic Damage - Chronic condition, secondary to cumulative UV/sun exposure - diffuse scaly erythematous macules with underlying dyspigmentation - Recommend daily broad spectrum sunscreen SPF 30+ to sun-exposed areas, reapply every 2 hours as needed.  - Staying in the shade or wearing long sleeves, sun glasses (UVA+UVB protection) and wide brim  hats (4-inch brim around the entire circumference of the hat) are also recommended for sun protection.  - Call for new or changing lesions.  Acrochordons (Skin Tags) - Fleshy, skin-colored pedunculated papules - Benign appearing.  - Observe. - If desired, they can be removed with an in office procedure that is not covered by insurance. - Please call the clinic if  you notice any new or changing lesions.  Skin cancer screening performed today.  Return in about 1 year (around 07/20/2023) for TBSE.  Luther Redo, CMA, am acting as scribe for Sarina Ser, MD . Documentation: I have reviewed the above documentation for accuracy and completeness, and I agree with the above.  Sarina Ser, MD

## 2022-07-29 ENCOUNTER — Encounter: Payer: Self-pay | Admitting: Dermatology

## 2022-11-07 ENCOUNTER — Encounter: Payer: Self-pay | Admitting: Urology

## 2022-11-07 ENCOUNTER — Encounter: Payer: Self-pay | Admitting: Dermatology

## 2022-11-08 ENCOUNTER — Other Ambulatory Visit: Payer: Self-pay | Admitting: Family Medicine

## 2022-11-08 DIAGNOSIS — Z9189 Other specified personal risk factors, not elsewhere classified: Secondary | ICD-10-CM

## 2022-11-16 ENCOUNTER — Ambulatory Visit
Admission: RE | Admit: 2022-11-16 | Discharge: 2022-11-16 | Disposition: A | Discharge status: Home / Self Care | Payer: Medicare PPO | Source: Ambulatory Visit | Attending: Family Medicine | Admitting: Family Medicine

## 2022-11-16 DIAGNOSIS — Z9189 Other specified personal risk factors, not elsewhere classified: Secondary | ICD-10-CM | POA: Insufficient documentation

## 2023-01-05 ENCOUNTER — Other Ambulatory Visit: Payer: Self-pay | Admitting: Internal Medicine

## 2023-01-05 DIAGNOSIS — I2510 Atherosclerotic heart disease of native coronary artery without angina pectoris: Secondary | ICD-10-CM

## 2023-01-05 DIAGNOSIS — Z7689 Persons encountering health services in other specified circumstances: Secondary | ICD-10-CM

## 2023-01-23 ENCOUNTER — Inpatient Hospital Stay: Admission: RE | Admit: 2023-01-23 | Payer: Medicare PPO | Source: Ambulatory Visit

## 2023-01-30 ENCOUNTER — Other Ambulatory Visit: Payer: Medicare PPO

## 2023-02-09 ENCOUNTER — Other Ambulatory Visit (HOSPITAL_COMMUNITY): Payer: Self-pay | Admitting: *Deleted

## 2023-02-09 ENCOUNTER — Encounter (HOSPITAL_COMMUNITY): Payer: Self-pay

## 2023-02-09 MED ORDER — METOPROLOL TARTRATE 100 MG PO TABS: ORAL_TABLET | ORAL | 0 refills | Status: DC

## 2023-02-10 ENCOUNTER — Telehealth (HOSPITAL_COMMUNITY): Payer: Self-pay | Admitting: *Deleted

## 2023-02-10 NOTE — Telephone Encounter (Signed)
Attempted to call patient regarding upcoming cardiac CT appointment. °Left message on voicemail with name and callback number ° °Miko Markwood RN Navigator Cardiac Imaging °[facility] °[phone] Office °[phone] Cell ° °

## 2023-02-13 ENCOUNTER — Ambulatory Visit
Admission: RE | Admit: 2023-02-13 | Discharge: 2023-02-13 | Disposition: A | Discharge status: Home / Self Care | Payer: Medicare PPO | Source: Ambulatory Visit | Attending: Internal Medicine | Admitting: Internal Medicine

## 2023-02-13 DIAGNOSIS — Z7689 Persons encountering health services in other specified circumstances: Secondary | ICD-10-CM | POA: Diagnosis present

## 2023-02-13 DIAGNOSIS — R931 Abnormal findings on diagnostic imaging of heart and coronary circulation: Secondary | ICD-10-CM | POA: Diagnosis not present

## 2023-02-13 DIAGNOSIS — I2510 Atherosclerotic heart disease of native coronary artery without angina pectoris: Secondary | ICD-10-CM | POA: Diagnosis present

## 2023-02-13 DIAGNOSIS — R0789 Other chest pain: Secondary | ICD-10-CM | POA: Diagnosis present

## 2023-02-13 LAB — POCT I-STAT CREATININE: Creatinine, Ser: 0.8 mg/dL (ref 0.61–1.24)

## 2023-02-13 MED ORDER — NITROGLYCERIN 0.4 MG SL SUBL
0.8000 mg | SUBLINGUAL_TABLET | Freq: Once | SUBLINGUAL | Status: AC
  Administered 2023-02-13: 0.8 mg via SUBLINGUAL
  Filled 2023-02-13: qty 25

## 2023-02-13 MED ORDER — IOHEXOL 350 MG/ML SOLN
100.0000 mL | Freq: Once | INTRAVENOUS | Status: AC | PRN
  Administered 2023-02-13: 100 mL via INTRAVENOUS

## 2023-02-13 NOTE — Progress Notes (Signed)
Patient tolerated procedure well. Ambulate w/o difficulty. Denies any lightheadedness or being dizzy. Pt denies any pain at this time. Sitting in chair. Pt is encouraged to drink additional water throughout the day and reason explained to patient. Patient verbalized understanding and all questions answered. ABC intact. No further needs at this time. Discharge from procedure area w/o issues. 

## 2023-02-14 ENCOUNTER — Ambulatory Visit
Admission: RE | Admit: 2023-02-14 | Discharge: 2023-02-14 | Disposition: A | Discharge status: Home / Self Care | Payer: Self-pay | Source: Ambulatory Visit | Attending: Cardiology | Admitting: Cardiology

## 2023-02-14 ENCOUNTER — Other Ambulatory Visit: Payer: Self-pay | Admitting: Cardiology

## 2023-02-14 DIAGNOSIS — R0789 Other chest pain: Secondary | ICD-10-CM

## 2023-04-05 ENCOUNTER — Ambulatory Visit (INDEPENDENT_AMBULATORY_CARE_PROVIDER_SITE_OTHER): Payer: Medicare PPO | Admitting: Urology

## 2023-04-05 ENCOUNTER — Encounter: Payer: Self-pay | Admitting: Urology

## 2023-04-05 VITALS — BP 143/83 | HR 80 | Ht 66.0 in | Wt 170.0 lb

## 2023-04-05 DIAGNOSIS — R972 Elevated prostate specific antigen [PSA]: Secondary | ICD-10-CM

## 2023-04-05 DIAGNOSIS — N401 Enlarged prostate with lower urinary tract symptoms: Secondary | ICD-10-CM

## 2023-04-05 LAB — URINALYSIS, COMPLETE
Bilirubin, UA: NEGATIVE
Glucose, UA: NEGATIVE
Ketones, UA: NEGATIVE
Leukocytes,UA: NEGATIVE
Nitrite, UA: NEGATIVE
Protein,UA: NEGATIVE
RBC, UA: NEGATIVE
Specific Gravity, UA: 1.015 (ref 1.005–1.030)
Urobilinogen, Ur: 0.2 mg/dL (ref 0.2–1.0)
pH, UA: 7 (ref 5.0–7.5)

## 2023-04-05 LAB — MICROSCOPIC EXAMINATION: RBC, Urine: NONE SEEN /hpf (ref 0–2)

## 2023-04-05 LAB — BLADDER SCAN AMB NON-IMAGING: Scan Result: 22

## 2023-04-05 NOTE — Progress Notes (Signed)
I, Duke Salvia, acting as a scribe for Riki Altes, MD., have documented all relevant documentation on the behalf of Riki Altes, MD, as directed by  Riki Altes, MD while in the presence of Riki Altes, MD.  04/05/2023 12:49 PM   Joshua Drake 1946-01-26 [id number]  Referring provider: Marisue Ivan, MD [phone] [facility] [address] [facility] [place],  [place] [address]  Chief Complaint  Patient presents with   Benign Prostatic Hypertrophy    Urologic history: 1.  Elevated PSA             -Biopsy 01/2012; uncorrected PSA 12.2             -Volume 81 cc; benign             -Recent PSAs 4-6 range  -PSA monitoring discontinued 2020  2.  BPH with LUTS             -TUMT Dr. Evelene Croon 2009             -Placed on Avodart post procedure 3.  Gross hematuria  -Eval 05/2020  -CTU no upper tract abnormalities  -Cystoscopy significant prostate enlargement with hypervascularity  -Prostate volume calculated 125 cc  HPI: 77 y.o. male presents for annual follow-up.  Doing well since last visit No bothersome LUTS Denies dysuria, gross hematuria Denies flank, abdominal or pelvic pain  Taking Dutasteride 3x weekly   PMH: Past Medical History:  Diagnosis Date   Actinic keratosis    Allergic rhinitis    BPH (benign prostatic hyperplasia)    GERD (gastroesophageal reflux disease)    Hyperlipidemia    Hypertension    Pre-diabetes     Surgical History: Past Surgical History:  Procedure Laterality Date   CHOLECYSTECTOMY     COLONOSCOPY WITH PROPOFOL N/A 04/06/2015   Procedure: COLONOSCOPY WITH PROPOFOL;  Surgeon: Wallace Cullens, MD;  Location: [facility] ENDOSCOPY;  Service: Gastroenterology;  Laterality: N/A;   microwave procedure     microwave procedure for BPH   OPEN REDUCTION INTERNAL FIXATION (ORIF) DISTAL RADIAL FRACTURE Right 11/21/2017   Procedure: OPEN REDUCTION INTERNAL FIXATION (ORIF) DISTAL RADIAL FRACTURE;  Surgeon: Kennedy Bucker, MD;  Location: [facility] ORS;   Service: Orthopedics;  Laterality: Right;    Home Medications:  Allergies as of 04/05/2023       Reactions   Contrast Media [iodinated Contrast Media] Hives        Medication List        Accurate as of April 05, 2023 12:49 PM. If you have any questions, ask your nurse or doctor.          STOP taking these medications    metoprolol tartrate 100 MG tablet Commonly known as: LOPRESSOR Stopped by: Riki Altes   tadalafil 20 MG tablet Commonly known as: CIALIS Stopped by: Riki Altes       TAKE these medications    atorvastatin 10 MG tablet Commonly known as: LIPITOR Take 10 mg by mouth daily.   dutasteride 0.5 MG capsule Commonly known as: AVODART Take 1 capsule (0.5 mg total) by mouth daily.   lisinopril-hydrochlorothiazide 10-12.5 MG tablet Commonly known as: ZESTORETIC Take 1 tablet by mouth daily.   pantoprazole 20 MG tablet Commonly known as: PROTONIX        Allergies:  Allergies  Allergen Reactions   Contrast Media [Iodinated Contrast Media] Hives    Family History: Family History  Problem Relation Age of Onset   Benign prostatic  hyperplasia Father    Benign prostatic hyperplasia Brother    Kidney cancer Neg Hx    Kidney disease Neg Hx    Prostate cancer Neg Hx     Social History:  reports that he quit smoking about 59 years ago. His smoking use included cigarettes. He started smoking about 63 years ago. He has a 2 pack-year smoking history. He has quit using smokeless tobacco. He reports that he does not drink alcohol and does not use drugs.   Physical Exam: BP (!) 143/83   Pulse 80   Ht 5\' 6"  (1.676 m)   Wt 170 lb (77.1 kg)   BMI 27.44 kg/m   Constitutional:  Alert and oriented, No acute distress. HEENT: [place] AT Respiratory: Normal respiratory effort, no increased work of breathing.   Assessment & Plan:    1.  BPH with LUTS Stable on dutasteride Continue annual follow-up  2.  Elevated PSA PSA drawn today  I have  reviewed the above documentation for accuracy and completeness, and I agree with the above.   Riki Altes, MD  [facility] [address], [address] [place], [place] [address] [phone]

## 2023-04-06 LAB — PSA: Prostate Specific Ag, Serum: 5 ng/mL — ABNORMAL HIGH (ref 0.0–4.0)

## 2023-07-20 ENCOUNTER — Ambulatory Visit: Payer: Medicare PPO | Admitting: Dermatology

## 2023-07-20 DIAGNOSIS — Z1283 Encounter for screening for malignant neoplasm of skin: Secondary | ICD-10-CM

## 2023-07-20 DIAGNOSIS — I781 Nevus, non-neoplastic: Secondary | ICD-10-CM

## 2023-07-20 DIAGNOSIS — L578 Other skin changes due to chronic exposure to nonionizing radiation: Secondary | ICD-10-CM

## 2023-07-20 DIAGNOSIS — D1801 Hemangioma of skin and subcutaneous tissue: Secondary | ICD-10-CM

## 2023-07-20 DIAGNOSIS — Z5111 Encounter for antineoplastic chemotherapy: Secondary | ICD-10-CM

## 2023-07-20 DIAGNOSIS — Z79899 Other long term (current) drug therapy: Secondary | ICD-10-CM

## 2023-07-20 DIAGNOSIS — L820 Inflamed seborrheic keratosis: Secondary | ICD-10-CM | POA: Diagnosis not present

## 2023-07-20 DIAGNOSIS — W908XXA Exposure to other nonionizing radiation, initial encounter: Secondary | ICD-10-CM

## 2023-07-20 DIAGNOSIS — L570 Actinic keratosis: Secondary | ICD-10-CM | POA: Diagnosis not present

## 2023-07-20 DIAGNOSIS — L814 Other melanin hyperpigmentation: Secondary | ICD-10-CM | POA: Diagnosis not present

## 2023-07-20 DIAGNOSIS — L918 Other hypertrophic disorders of the skin: Secondary | ICD-10-CM

## 2023-07-20 DIAGNOSIS — L821 Other seborrheic keratosis: Secondary | ICD-10-CM

## 2023-07-20 DIAGNOSIS — D229 Melanocytic nevi, unspecified: Secondary | ICD-10-CM

## 2023-07-20 DIAGNOSIS — Z7189 Other specified counseling: Secondary | ICD-10-CM

## 2023-07-20 DIAGNOSIS — D170 Benign lipomatous neoplasm of skin and subcutaneous tissue of head, face and neck: Secondary | ICD-10-CM

## 2023-07-20 DIAGNOSIS — Z872 Personal history of diseases of the skin and subcutaneous tissue: Secondary | ICD-10-CM

## 2023-07-20 MED ORDER — FLUOROURACIL 5 % EX CREA: TOPICAL_CREAM | Freq: Two times a day (BID) | CUTANEOUS | 1 refills | Status: DC

## 2023-07-20 NOTE — Progress Notes (Signed)
Follow-Up Visit   Subjective  Joshua Drake is a 77 y.o. male who presents for the following: Skin Cancer Screening and Full Body Skin Exam check spot nose 2wks itchy, hx of AKs  The patient presents for Total-Body Skin Exam (TBSE) for skin cancer screening and mole check. The patient has spots, moles and lesions to be evaluated, some may be new or changing and the patient may have concern these could be cancer.    The following portions of the chart were reviewed this encounter and updated as appropriate: medications, allergies, medical history  Review of Systems:  No other skin or systemic complaints except as noted in HPI or Assessment and Plan.  Objective  Well appearing patient in no apparent distress; mood and affect are within normal limits.  A full examination was performed including scalp, head, eyes, ears, nose, lips, neck, chest, axillae, abdomen, back, buttocks, bilateral upper extremities, bilateral lower extremities, hands, feet, fingers, toes, fingernails, and toenails. All findings within normal limits unless otherwise noted below.   Relevant physical exam findings are noted in the Assessment and Plan.  face, L ear x 4 (4) Pink scaly macules  L forehead x 1, L forehead x 1, R calf x 3 (5) Stuck on waxy paps with erythema    Assessment & Plan   SKIN CANCER SCREENING PERFORMED TODAY.  ACTINIC DAMAGE WITH PRECANCEROUS ACTINIC KERATOSES Counseling for Topical Chemotherapy Management: Patient exhibits: - Severe, confluent actinic changes with pre-cancerous actinic keratoses that is secondary to cumulative UV radiation exposure over time - Condition that is severe; chronic, not at goal. - diffuse scaly erythematous macules and papules with underlying dyspigmentation - Discussed Prescription "Field Treatment" topical Chemotherapy for Severe, Chronic Confluent Actinic Changes with Pre-Cancerous Actinic Keratoses Field treatment involves treatment of an entire area  of skin that has confluent Actinic Changes (Sun/ Ultraviolet light damage) and PreCancerous Actinic Keratoses by method of PhotoDynamic Therapy (PDT) and/or prescription Topical Chemotherapy agents such as 5-fluorouracil, 5-fluorouracil/calcipotriene, and/or imiquimod.  The purpose is to decrease the number of clinically evident and subclinical PreCancerous lesions to prevent progression to development of skin cancer by chemically destroying early precancer changes that may or may not be visible.  It has been shown to reduce the risk of developing skin cancer in the treated area. As a result of treatment, redness, scaling, crusting, and open sores may occur during treatment course. One or more than one of these methods may be used and may have to be used several times to control, suppress and eliminate the PreCancerous changes. Discussed treatment course, expected reaction, and possible side effects. - Recommend daily broad spectrum sunscreen SPF 30+ to sun-exposed areas, reapply every 2 hours as needed.  - Staying in the shade or wearing long sleeves, sun glasses (UVA+UVB protection) and wide brim hats (4-inch brim around the entire circumference of the hat) are also recommended. - Call for new or changing lesions.  -- Start 5-fluorouracil/calcipotriene cream twice a day for 7 days to affected areas including scalp, may repeat in 1 month if still feel rough areas. Prescription sent to [facility]. Patient advised they will receive an email to purchase the medication online and have it sent to their home. Patient provided with handout reviewing treatment course and side effects and advised to call or message [place] on MyChart with any concerns.  Reviewed course of treatment and expected reaction.  Patient advised to expect inflammation and crusting and advised that erosions are possible.  Patient  advised to be diligent with sun protection during and after treatment. Counseled to keep  medication out of reach of children and pets.  LENTIGINES, SEBORRHEIC KERATOSES, HEMANGIOMAS - Benign normal skin lesions - Benign-appearing - Call for any changes  MELANOCYTIC NEVI - Tan-brown and/or pink-flesh-colored symmetric macules and papules - Benign appearing on exam today - Observation - Call clinic for new or changing moles - Recommend daily use of broad spectrum spf 30+ sunscreen to sun-exposed areas.   TELANGIECTASIA Nose  Exam: dilated blood vessel(s)  Treatment Plan: Benign appearing on exam Call for changes  Lipoma  Exam: Subcutaneous rubbery nodule(s) x 2 5.0cm and 4.0cm Location: post neck  Benign-appearing. Exam most consistent with a lipoma. Discussed that a lipoma is a benign fatty growth that can grow over time and sometimes get irritated. Recommend observation if it is not bothersome or changing. Discussed option of ILK injections or surgical excision to remove it if it is growing, symptomatic, or other changes noted. Please call for new or changing lesions so they can be evaluated.   Acrochordons (Skin Tags) - Fleshy, skin-colored pedunculated papules - Benign appearing.  - Observe. - If desired, they can be removed with an in office procedure that is not covered by insurance. - Please call the clinic if you notice any new or changing lesions.   AK (actinic keratosis) (4) face, L ear x 4  Actinic keratoses are precancerous spots that appear secondary to cumulative UV radiation exposure/sun exposure over time. They are chronic with expected duration over 1 year. A portion of actinic keratoses will progress to squamous cell carcinoma of the skin. It is not possible to reliably predict which spots will progress to skin cancer and so treatment is recommended to prevent development of skin cancer.  Recommend daily broad spectrum sunscreen SPF 30+ to sun-exposed areas, reapply every 2 hours as needed.  Recommend staying in the shade or wearing long sleeves,  sun glasses (UVA+UVB protection) and wide brim hats (4-inch brim around the entire circumference of the hat). Call for new or changing lesions.  Destruction of lesion - face, L ear x 4 (4) Complexity: simple   Destruction method: cryotherapy   Informed consent: discussed and consent obtained   Timeout:  patient name, date of birth, surgical site, and procedure verified Lesion destroyed using liquid nitrogen: Yes   Region frozen until ice ball extended beyond lesion: Yes   Outcome: patient tolerated procedure well with no complications   Post-procedure details: wound care instructions given    Inflamed seborrheic keratosis (5) L forehead x 1, L forehead x 1, R calf x 3  Symptomatic, irritating, patient would like treated.  Destruction of lesion - L forehead x 1, L forehead x 1, R calf x 3 (5) Complexity: simple   Destruction method: cryotherapy   Informed consent: discussed and consent obtained   Timeout:  patient name, date of birth, surgical site, and procedure verified Lesion destroyed using liquid nitrogen: Yes   Region frozen until ice ball extended beyond lesion: Yes   Outcome: patient tolerated procedure well with no complications   Post-procedure details: wound care instructions given     Return in about 1 year (around 07/19/2024) for TBSE, Hx of AKs.  I, Ardis Rowan, RMA, am acting as scribe for Armida Sans, MD .   Documentation: I have reviewed the above documentation for accuracy and completeness, and I agree with the above.  Armida Sans, MD

## 2023-07-20 NOTE — Patient Instructions (Addendum)
Start 5-fluorouracil/calcipotriene cream twice a day for 7 days to affected areas including scalp, may repeat in 1 month. Prescription sent to [facility]. Patient advised they will receive an email to purchase the medication online and have it sent to their home. Patient provided with handout reviewing treatment course and side effects and advised to call or message [place] on MyChart with any concerns.  Instructions for [facility]  One or more of your medications was sent to the [facility] order [facility]. You will receive an email from them and can purchase the medicine through that link. It will then be mailed to your home at the address you confirmed. If for any reason you do not receive an email from them, please check your spam folder. If you still do not find the email, please let us know. [facility] phone number is [phone].   5-Fluorouracil/Calcipotriene Patient Education   Actinic keratoses are the dry, red scaly spots on the skin caused by sun damage. A portion of these spots can turn into skin cancer with time, and treating them can help prevent development of skin cancer.   Treatment of these spots requires removal of the defective skin cells. There are various ways to remove actinic keratoses, including freezing with liquid nitrogen, treatment with creams, or treatment with a blue light procedure in the office.   5-fluorouracil cream is a topical cream used to treat actinic keratoses. It works by interfering with the growth of abnormal fast-growing skin cells, such as actinic keratoses. These cells peel off and are replaced by healthy ones.   5-fluorouracil/calcipotriene is a combination of the 5-fluorouracil cream with a vitamin D analog cream called calcipotriene. The calcipotriene alone does not treat actinic keratoses. However, when it is combined with 5-fluorouracil, it helps the 5-fluorouracil treat the actinic  keratoses much faster so that the same results can be achieved with a much shorter treatment time.  INSTRUCTIONS FOR 5-FLUOROURACIL/CALCIPOTRIENE CREAM:   5-fluorouracil/calcipotriene cream typically only needs to be used for 4-7 days. A thin layer should be applied twice a day to the treatment areas recommended by your physician.   If your physician prescribed you separate tubes of 5-fluourouracil and calcipotriene, apply a thin layer of 5-fluorouracil followed by a thin layer of calcipotriene.   Avoid contact with your eyes, nostrils, and mouth. Do not use 5-fluorouracil/calcipotriene cream on infected or open wounds.   You will develop redness, irritation and some crusting at areas where you have pre-cancer damage/actinic keratoses. IF YOU DEVELOP PAIN, BLEEDING, OR SIGNIFICANT CRUSTING, STOP THE TREATMENT EARLY - you have already gotten a good response and the actinic keratoses should clear up well.  Wash your hands after applying 5-fluorouracil 5% cream on your skin.   A moisturizer or sunscreen with a minimum SPF 30 should be applied each morning.   Once you have finished the treatment, you can apply a thin layer of Vaseline twice a day to irritated areas to soothe and calm the areas more quickly. If you experience significant discomfort, contact your physician.  For some patients it is necessary to repeat the treatment for best results.  SIDE EFFECTS: When using 5-fluorouracil/calcipotriene cream, you may have mild irritation, such as redness, dryness, swelling, or a mild burning sensation. This usually resolves within 2 weeks. The more actinic keratoses you have, the more redness and inflammation you can expect during treatment. Eye irritation has been reported rarely. If this occurs, please let us know.  If you  have any trouble using this cream, please call the office. If you have any other questions about this information, please do not hesitate to ask me before you leave the  office.    Cryotherapy Aftercare  Wash gently with soap and water everyday.   Apply Vaseline and Band-Aid daily until healed.   Due to recent changes in healthcare laws, you may see results of your pathology and/or laboratory studies on MyChart before the doctors have had a chance to review them. We understand that in some cases there may be results that are confusing or concerning to you. Please understand that not all results are received at the same time and often the doctors may need to interpret multiple results in order to provide you with the best plan of care or course of treatment. Therefore, we ask that you please give [place] 2 business days to thoroughly review all your results before contacting the office for clarification. Should we see a critical lab result, you will be contacted sooner.   If You Need Anything After Your Visit  If you have any questions or concerns for your doctor, please call our main line at [phone] and press option 4 to reach your doctor's medical assistant. If no one answers, please leave a voicemail as directed and we will return your call as soon as possible. Messages left after 4 pm will be answered the following business day.   You may also send [place] a message via MyChart. We typically respond to MyChart messages within 1-2 business days.  For prescription refills, please ask your pharmacy to contact our office. Our fax number is [phone].  If you have an urgent issue when the clinic is closed that cannot wait until the next business day, you can page your doctor at the number below.    Please note that while we do our best to be available for urgent issues outside of office hours, we are not available 24/7.   If you have an urgent issue and are unable to reach [place], you may choose to seek medical care at your doctor's office, retail clinic, urgent care center, or emergency room.  If you have a medical emergency, please immediately call 911 or go to  the emergency department.  Pager Numbers  - Dr. Gwen Pounds: [phone]  - Dr. Roseanne Reno: [phone]  - Dr. Katrinka Blazing: [phone]   In the event of inclement weather, please call our main line at [phone] for an update on the status of any delays or closures.  Dermatology Medication Tips: Please keep the boxes that topical medications come in in order to help keep track of the instructions about where and how to use these. Pharmacies typically print the medication instructions only on the boxes and not directly on the medication tubes.   If your medication is too expensive, please contact our office at [phone] option 4 or send [place] a message through MyChart.   We are unable to tell what your co-pay for medications will be in advance as this is different depending on your insurance coverage. However, we may be able to find a substitute medication at lower cost or fill out paperwork to get insurance to cover a needed medication.   If a prior authorization is required to get your medication covered by your insurance company, please allow [place] 1-2 business days to complete this process.  Drug prices often vary depending on where the prescription is filled and some pharmacies may offer cheaper prices.  The website www.goodrx.com  contains coupons for medications through different pharmacies. The prices here do not account for what the cost may be with help from insurance (it may be cheaper with your insurance), but the website can give you the price if you did not use any insurance.  - You can print the associated coupon and take it with your prescription to the pharmacy.  - You may also stop by our office during regular business hours and pick up a GoodRx coupon card.  - If you need your prescription sent electronically to a different pharmacy, notify our office through [facility] or by phone at [phone] option 4.     Si Usted Necesita Algo Despus de Su Visita  Tambin  puede enviarnos un mensaje a travs de Clinical cytogeneticist. Por lo general respondemos a los mensajes de MyChart en el transcurso de 1 a 2 das hbiles.  Para renovar recetas, por favor pida a su farmacia que se ponga en contacto con nuestra oficina. Annie Sable de fax es [place] [phone].  Si tiene un asunto urgente cuando la clnica est cerrada y que no puede esperar hasta el siguiente da hbil, puede llamar/localizar a su doctor(a) al nmero que aparece a continuacin.   Por favor, tenga en cuenta que aunque hacemos todo lo posible para estar disponibles para asuntos urgentes fuera del horario de [place], no estamos disponibles las 24 horas del da, los 7 [address]  [address] de la [place].   Si tiene un problema urgente y no puede comunicarse con nosotros, puede optar por buscar atencin mdica  en el consultorio de su doctor(a), en una clnica privada, en un centro de atencin urgente o en una sala de emergencias.  Si tiene Engineer, drilling, por favor llame inmediatamente al 911 o vaya a la sala de emergencias.  Nmeros de bper  - Dr. Gwen Pounds: [phone]  - Dra. Roseanne Reno: [phone]  - Dr. Katrinka Blazing: [phone]   En caso de inclemencias del tiempo, por favor llame a Lacy Duverney principal al [phone] para una actualizacin sobre el [place] de cualquier retraso o cierre.  Consejos para la medicacin en dermatologa: Por favor, guarde las cajas en las que vienen los medicamentos de uso tpico para ayudarle a seguir las instrucciones sobre dnde y cmo usarlos. Las farmacias generalmente imprimen las instrucciones del medicamento slo en las cajas y no directamente en los tubos del [place].   Si su medicamento es muy caro, por favor, pngase en contacto con Rolm Gala llamando al [phone] y presione la opcin 4 o envenos un mensaje a travs de Clinical cytogeneticist.   No podemos decirle cul ser su copago por los medicamentos por adelantado ya que esto es diferente dependiendo de la cobertura de su  seguro. Sin embargo, es posible que podamos encontrar un medicamento sustituto a Audiological scientist un formulario para que el seguro cubra el medicamento que se considera necesario.   Si se requiere una autorizacin previa para que su compaa de seguros [place] su medicamento, por favor permtanos de 1 a 2 das hbiles para completar [address]  Los precios de los medicamentos varan con frecuencia dependiendo del Environmental consultant de dnde se surte la receta y alguna farmacias pueden ofrecer precios ms baratos.  El sitio web www.goodrx.com tiene cupones para medicamentos de Health and safety inspector. Los precios aqu no tienen en cuenta lo que podra costar con la ayuda del seguro (puede ser ms barato con su seguro), pero el sitio web puede darle el precio si no utiliz Tourist information centre manager.  - Puede imprimir  el cupn correspondiente y llevarlo con su receta a la farmacia.  - Tambin puede pasar por nuestra oficina durante el horario de atencin regular y Education officer, museum una tarjeta de cupones de GoodRx.  - Si necesita que su receta se enve electrnicamente a una farmacia diferente, informe a nuestra oficina a travs de MyChart de [place] o por telfono llamando al [phone] y presione la opcin 4.

## 2023-07-29 ENCOUNTER — Encounter: Payer: Self-pay | Admitting: Dermatology

## 2023-11-20 ENCOUNTER — Other Ambulatory Visit: Payer: Self-pay | Admitting: *Deleted

## 2023-11-20 ENCOUNTER — Encounter: Payer: Self-pay | Admitting: Urology

## 2023-11-20 MED ORDER — DUTASTERIDE 0.5 MG PO CAPS: 0.5000 mg | ORAL_CAPSULE | Freq: Every day | ORAL | 3 refills | Status: AC

## 2024-03-29 ENCOUNTER — Other Ambulatory Visit: Payer: Self-pay

## 2024-03-29 DIAGNOSIS — R972 Elevated prostate specific antigen [PSA]: Secondary | ICD-10-CM

## 2024-03-29 DIAGNOSIS — N401 Enlarged prostate with lower urinary tract symptoms: Secondary | ICD-10-CM

## 2024-03-30 LAB — PSA: Prostate Specific Ag, Serum: 8 ng/mL — ABNORMAL HIGH (ref 0.0–4.0)

## 2024-04-04 ENCOUNTER — Ambulatory Visit (INDEPENDENT_AMBULATORY_CARE_PROVIDER_SITE_OTHER): Payer: Self-pay | Admitting: Urology

## 2024-04-04 ENCOUNTER — Encounter: Payer: Self-pay | Admitting: Urology

## 2024-04-04 VITALS — BP 154/86 | HR 76 | Ht 66.0 in | Wt 175.0 lb

## 2024-04-04 DIAGNOSIS — R972 Elevated prostate specific antigen [PSA]: Secondary | ICD-10-CM

## 2024-04-04 DIAGNOSIS — N401 Enlarged prostate with lower urinary tract symptoms: Secondary | ICD-10-CM | POA: Diagnosis not present

## 2024-04-04 NOTE — Progress Notes (Signed)
 04/04/2024 9:37 AM   Joshua Drake 23-Jan-1946 [id number]  Referring provider: Alla Amis, MD [id number] [facility] [address] [facility] [place],  [place] [address]  Chief Complaint  Patient presents with   Follow-up    Urologic history: 1.  Elevated PSA             -Biopsy 01/2012; uncorrected PSA 12.2             -Volume 81 cc; benign             -Recent PSAs 4-6 range  -PSA monitoring discontinued 2020  2.  BPH with LUTS             -TUMT Dr. Kassie 2009             -Placed on Avodart  post procedure 3.  Gross hematuria  -Eval 05/2020  -CTU no upper tract abnormalities  -Cystoscopy significant prostate enlargement with hypervascularity  -Prostate volume calculated 125 cc  HPI: 78 y.o. male presents for annual follow-up.  Doing well since last visit No bothersome LUTS Denies dysuria, gross hematuria Denies flank, abdominal or pelvic pain  Taking Dutasteride  3x weekly PSA 03/29/2024 was 8.0 (uncorrected)   PMH: Past Medical History:  Diagnosis Date   Actinic keratosis    Allergic rhinitis    BPH (benign prostatic hyperplasia)    GERD (gastroesophageal reflux disease)    Hyperlipidemia    Hypertension    Pre-diabetes     Surgical History: Past Surgical History:  Procedure Laterality Date   CHOLECYSTECTOMY     COLONOSCOPY WITH PROPOFOL  N/A 04/06/2015   Procedure: COLONOSCOPY WITH PROPOFOL ;  Surgeon: Deward CINDERELLA Piedmont, MD;  Location: [facility] END[facility];  Service: Gastroenterology;  Laterality: N/A;   microwave procedure     microwave procedure for BPH   OPEN REDUCTION INTERNAL FIXATION (ORIF) DISTAL RADIAL FRACTURE Right 11/21/2017   Procedure: OPEN REDUCTION INTERNAL FIXATION (ORIF) DISTAL RADIAL FRACTURE;  Surgeon: Kathlynn Sharper, MD;  Location: [facility] ORS;  Service: Orthopedics;  Laterality: Right;    Home Medications:  Allergies as of 04/04/2024       Reactions   Contrast Media [iodinated Contrast Media] Hives        Medication List        Accurate as  of April 04, 2024  9:37 AM. If you have any questions, ask your nurse or doctor.          STOP taking these medications    fluorouracil  5 % cream Commonly known as: EFUDEX        TAKE these medications    aspirin EC 81 MG tablet Take by mouth.   atorvastatin 40 MG tablet Commonly known as: LIPITOR Take 40 mg by mouth daily. What changed: Another medication with the same name was removed. Continue taking this medication, and follow the directions you see here.   dutasteride  0.5 MG capsule Commonly known as: AVODART  Take 1 capsule (0.5 mg total) by mouth daily.   lisinopril-hydrochlorothiazide 10-12.5 MG tablet Commonly known as: ZESTORETIC Take 1 tablet by mouth daily.   pantoprazole 20 MG tablet Commonly known as: PROTONIX        Allergies:  Allergies  Allergen Reactions   Contrast Media [Iodinated Contrast Media] Hives    Family History: Family History  Problem Relation Age of Onset   Benign prostatic hyperplasia Father    Benign prostatic hyperplasia Brother    Kidney cancer Neg Hx    Kidney disease Neg Hx    Prostate cancer Neg  Hx     Social History:  reports that he quit smoking about 60 years ago. His smoking use included cigarettes. He started smoking about 64 years ago. He has a 2 pack-year smoking history. He has quit using smokeless tobacco. He reports that he does not drink alcohol and does not use drugs.   Physical Exam: BP (!) 154/86   Pulse 76   Ht 5' 6 (1.676 m)   Wt 175 lb (79.4 kg)   BMI 28.25 kg/m   Constitutional:  Alert and oriented, No acute distress. HEENT: [place] AT Respiratory: Normal respiratory effort, no increased work of breathing. GU: Prostate 80+ cc, smooth without nodules   Assessment & Plan:    1.  BPH with LUTS Stable on dutasteride  Continue annual follow-up  2.  Elevated PSA PSA elevated but baseline Significant prostate volume and most likely transient elevation Lab visit repeat PSA 2 months   Joshua JAYSON Barba, MD  [facility] [address], [address] [place], [place] [address] [phone]

## 2024-06-05 ENCOUNTER — Other Ambulatory Visit

## 2024-06-19 ENCOUNTER — Other Ambulatory Visit

## 2024-06-19 DIAGNOSIS — R972 Elevated prostate specific antigen [PSA]: Secondary | ICD-10-CM

## 2024-06-20 ENCOUNTER — Ambulatory Visit: Payer: Self-pay | Admitting: Urology

## 2024-06-20 LAB — PSA: Prostate Specific Ag, Serum: 5.7 ng/mL — ABNORMAL HIGH (ref 0.0–4.0)

## 2024-07-24 ENCOUNTER — Ambulatory Visit: Payer: Medicare PPO | Admitting: Dermatology

## 2024-10-22 ENCOUNTER — Ambulatory Visit: Admitting: Dermatology

## 2025-03-24 ENCOUNTER — Other Ambulatory Visit

## 2025-04-04 ENCOUNTER — Ambulatory Visit: Admitting: Urology
